# Patient Record
Sex: Male | Born: 1955 | Race: White | Hispanic: No | Marital: Single | Smoking: Never smoker
Health system: Southern US, Community
[De-identification: ages and names within clinical notes are randomized; demographics above are authoritative.]

## PROBLEM LIST (undated history)

## (undated) DIAGNOSIS — I10 Essential (primary) hypertension: Secondary | ICD-10-CM

## (undated) DIAGNOSIS — H9193 Unspecified hearing loss, bilateral: Secondary | ICD-10-CM

---

## 2004-08-22 ENCOUNTER — Ambulatory Visit: Payer: Self-pay | Admitting: Gastroenterology

## 2007-01-26 ENCOUNTER — Emergency Department: Payer: Self-pay | Admitting: Emergency Medicine

## 2007-01-28 ENCOUNTER — Ambulatory Visit: Payer: Self-pay | Admitting: Emergency Medicine

## 2007-09-10 ENCOUNTER — Other Ambulatory Visit: Payer: Self-pay

## 2007-09-10 ENCOUNTER — Emergency Department: Payer: Self-pay | Admitting: Internal Medicine

## 2007-09-19 ENCOUNTER — Ambulatory Visit: Payer: Self-pay | Admitting: Surgery

## 2007-09-24 ENCOUNTER — Ambulatory Visit: Payer: Self-pay | Admitting: Surgery

## 2007-11-27 ENCOUNTER — Ambulatory Visit: Payer: Self-pay | Admitting: Surgery

## 2007-12-01 ENCOUNTER — Ambulatory Visit: Payer: Self-pay | Admitting: Surgery

## 2008-01-05 ENCOUNTER — Ambulatory Visit: Payer: Self-pay | Admitting: Gastroenterology
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
## 2008-09-28 IMAGING — US ABDOMEN ULTRASOUND
1 series · 17 of 25 positions shown · non-contrast
Comparison: none

REASON FOR EXAM: Abdominal pain
COMMENTS:

[Series 1: abdomen ultrasound · 17 of 54 slices shown]
[im 1/54]
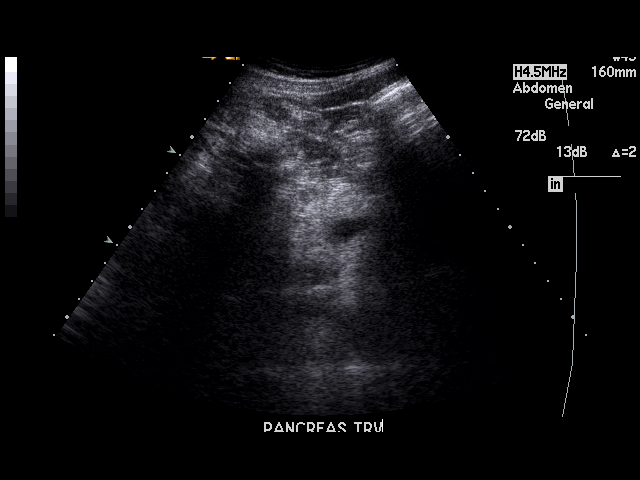
[im 5/54]
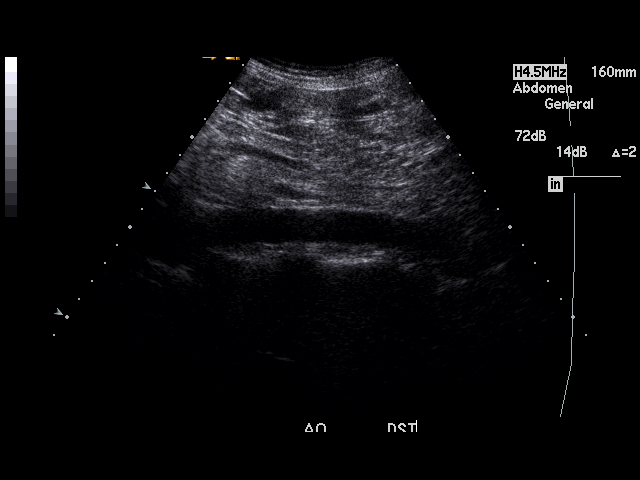
[im 7/54]
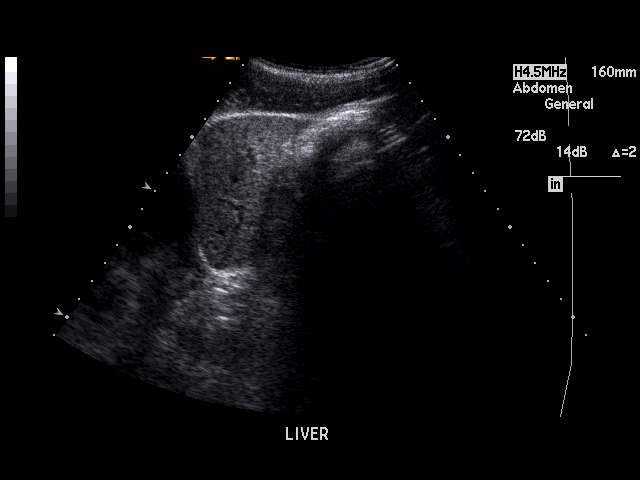
[im 12/54]
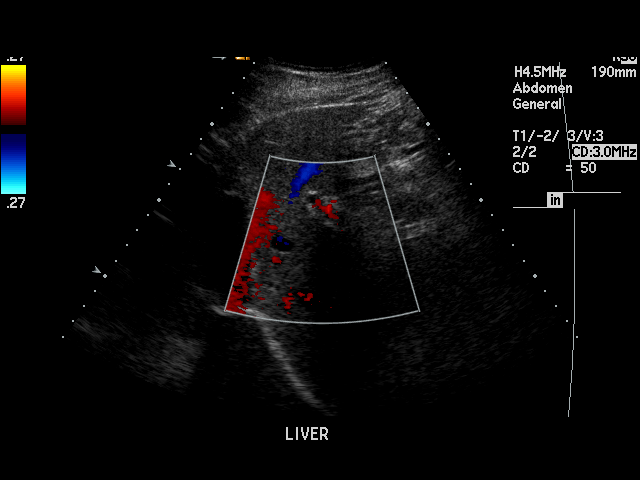
[im 14/54]
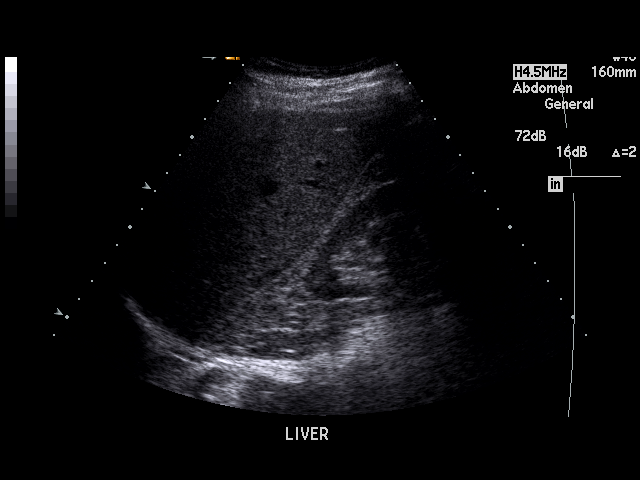
[im 18/54]
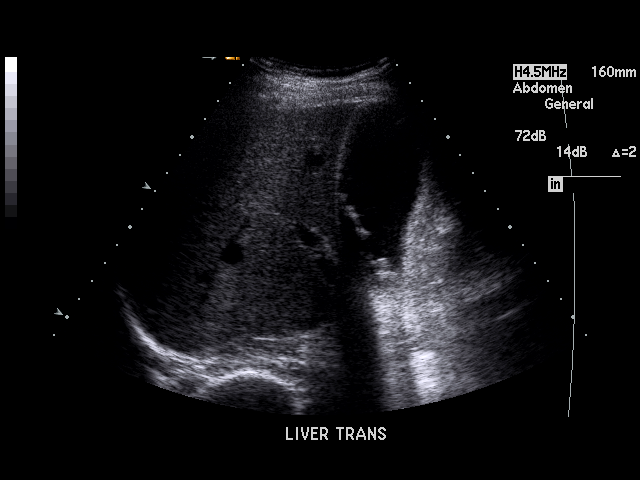
[im 20/54]
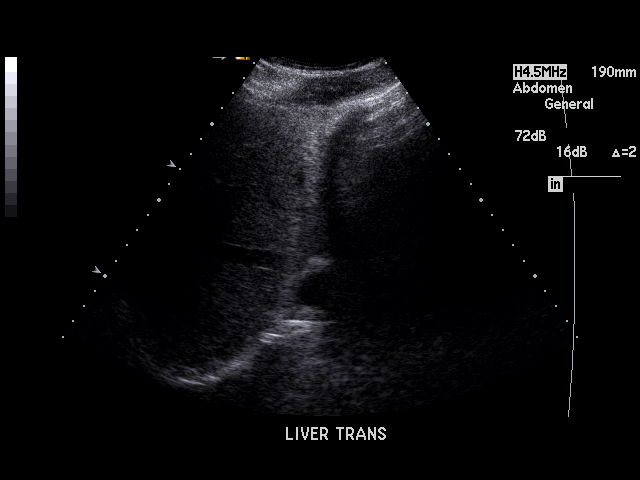
[im 25/54]
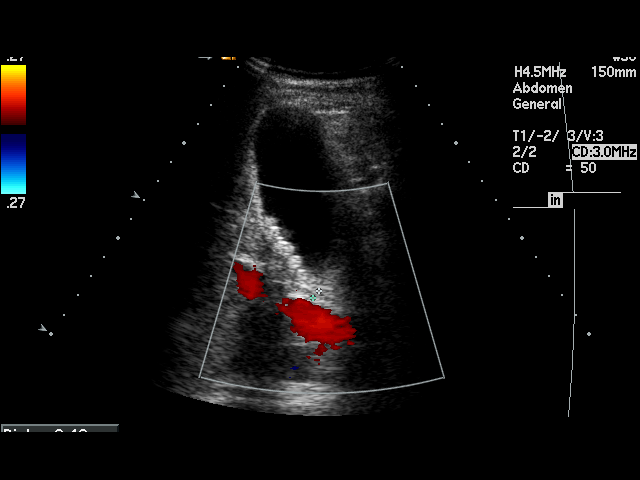
[im 27/54]
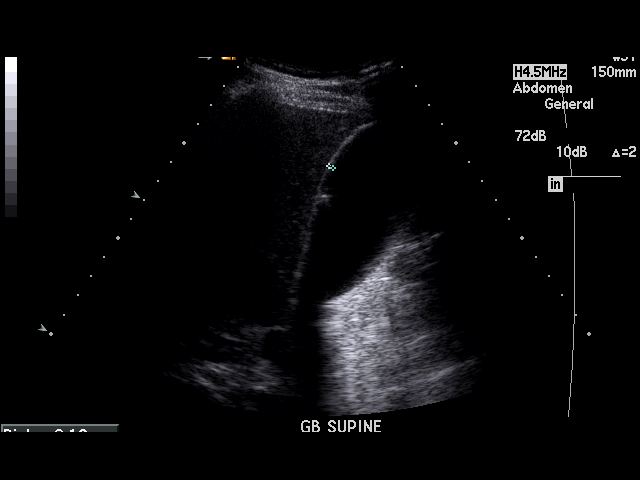
[im 29/54]
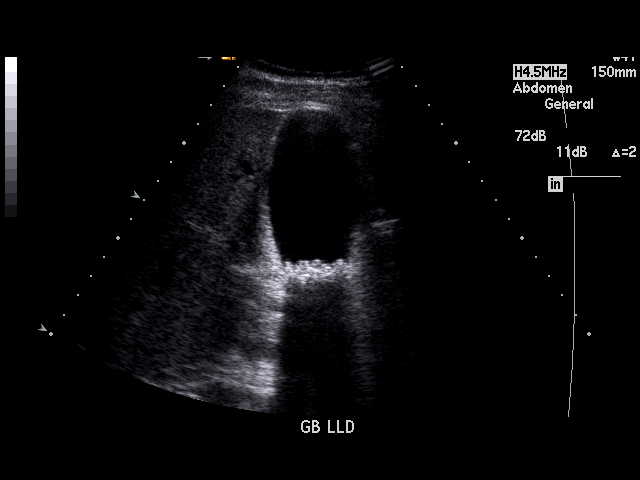
[im 34/54]
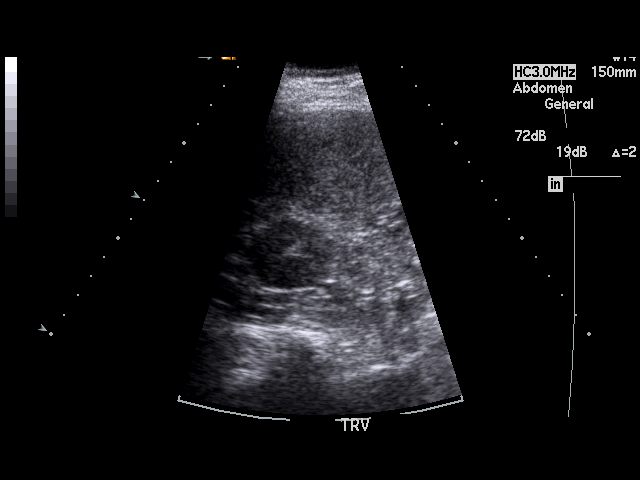
[im 36/54]
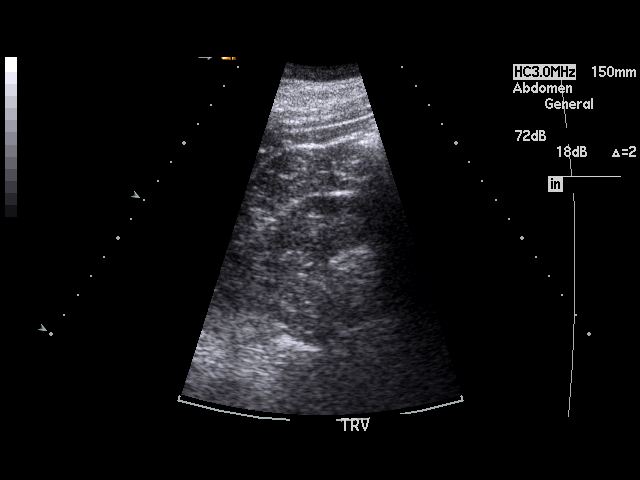
[im 40/54]
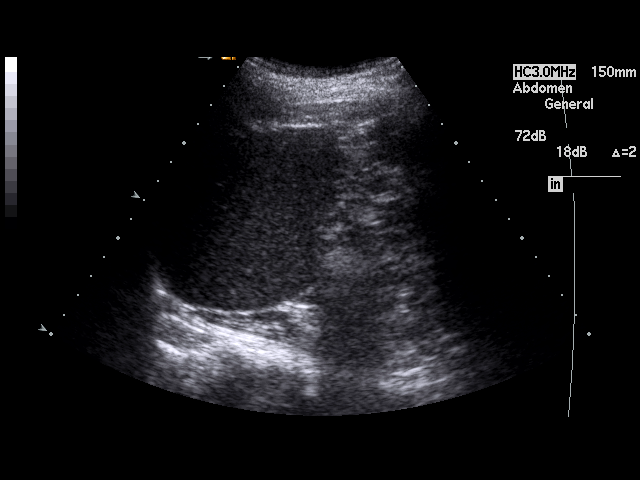
[im 42/54]
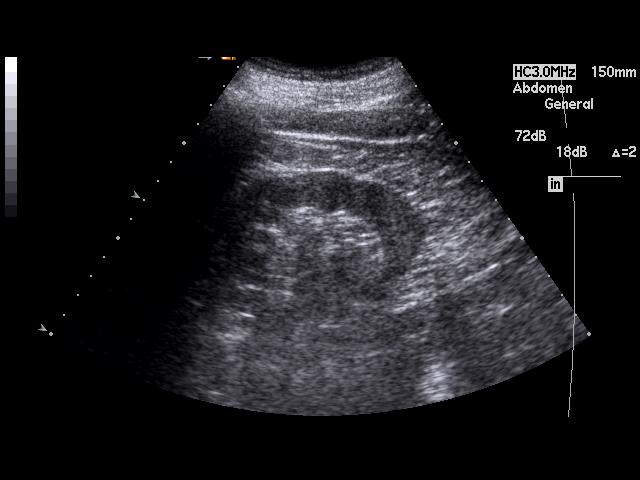
[im 47/54]
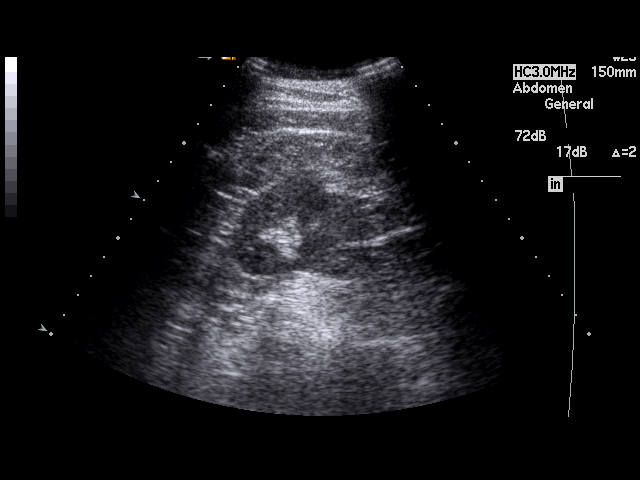
[im 49/54]
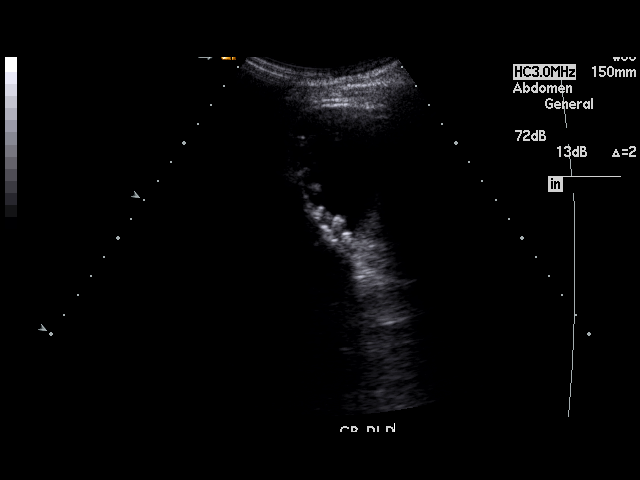
[im 54/54]
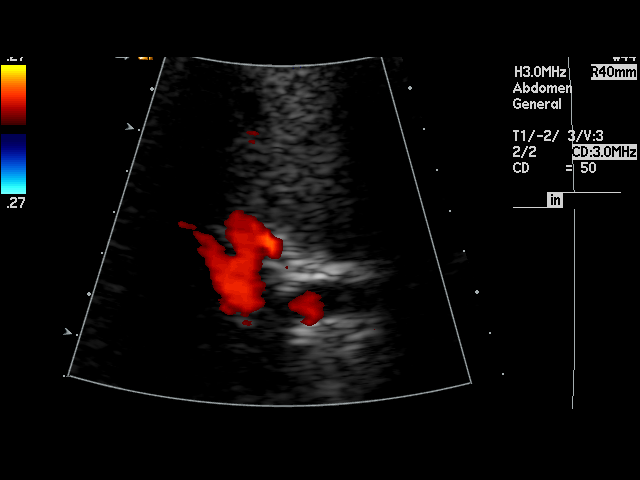

[17 of 25 positions shown; findings below may reference images not displayed]

PROCEDURE:     US  - US ABDOMEN GENERAL SURVEY  - September 10, 2007  [DATE]

RESULT:     The gallbladder contains multiple, echogenic, mobile, shadowing
stones. There is no sonographic Murphy's sign. No gallbladder wall
thickening or pericholecystic fluid is seen. The common bile duct measures
4.0 mm in diameter. The liver exhibits normal echotexture with no focal mass
or ductal dilation. Portal venous flow is normal in direction toward the
liver. The pancreas is only partially imaged due to the presence of bowel
gas. The spleen, abdominal aorta and kidneys exhibit no acute abnormality.
There is no evidence of ascites.
IMPRESSION: 1.  There are multiple, mobile gallstones present. There is no positive
sonographic Murphy's sign.
2.  The pancreas is partially obscured due to the presence of bowel gas.

This report was called to the [HOSPITAL] the conclusion of the
study.

## 2013-05-11 ENCOUNTER — Ambulatory Visit: Payer: Self-pay | Admitting: Gastroenterology

## 2014-10-15 ENCOUNTER — Ambulatory Visit
Admission: EM | Admit: 2014-10-15 | Discharge: 2014-10-15 | Disposition: A | Discharge status: Home / Self Care | Payer: Medicare HMO | Attending: Family Medicine | Admitting: Family Medicine

## 2014-10-15 ENCOUNTER — Encounter: Payer: Self-pay | Admitting: Emergency Medicine

## 2014-10-15 DIAGNOSIS — J301 Allergic rhinitis due to pollen: Secondary | ICD-10-CM

## 2014-10-15 HISTORY — DX: Unspecified hearing loss, bilateral: H91.93

## 2014-10-15 MED ORDER — FLUTICASONE PROPIONATE 50 MCG/ACT NA SUSP: 2.0000 | Freq: Every day | NASAL | Status: AC

## 2014-10-15 MED ORDER — SALINE SPRAY 0.65 % NA SOLN: 2.0000 | NASAL | Status: AC

## 2014-10-15 NOTE — Discharge Instructions (Signed)

## 2014-10-15 NOTE — ED Notes (Signed)
Pt reports nose difficult to breath through.
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
## 2017-03-09 ENCOUNTER — Encounter: Payer: Self-pay | Admitting: Emergency Medicine

## 2017-03-09 ENCOUNTER — Emergency Department
Admission: EM | Admit: 2017-03-09 | Discharge: 2017-03-09 | Disposition: A | Discharge status: Home / Self Care | Payer: Medicare Other | Attending: Emergency Medicine | Admitting: Emergency Medicine

## 2017-03-09 ENCOUNTER — Emergency Department: Payer: Medicare Other

## 2017-03-09 DIAGNOSIS — J189 Pneumonia, unspecified organism: Secondary | ICD-10-CM

## 2017-03-09 DIAGNOSIS — Y9389 Activity, other specified: Secondary | ICD-10-CM | POA: Insufficient documentation

## 2017-03-09 DIAGNOSIS — T17308A Unspecified foreign body in larynx causing other injury, initial encounter: Secondary | ICD-10-CM | POA: Diagnosis not present

## 2017-03-09 DIAGNOSIS — Y33XXXA Other specified events, undetermined intent, initial encounter: Secondary | ICD-10-CM | POA: Insufficient documentation

## 2017-03-09 DIAGNOSIS — Y999 Unspecified external cause status: Secondary | ICD-10-CM | POA: Insufficient documentation

## 2017-03-09 DIAGNOSIS — R55 Syncope and collapse: Secondary | ICD-10-CM | POA: Diagnosis present

## 2017-03-09 DIAGNOSIS — J181 Lobar pneumonia, unspecified organism: Secondary | ICD-10-CM | POA: Insufficient documentation

## 2017-03-09 DIAGNOSIS — Y929 Unspecified place or not applicable: Secondary | ICD-10-CM | POA: Insufficient documentation

## 2017-03-09 MED ORDER — PREDNISONE 10 MG (21) PO TBPK: ORAL_TABLET | Freq: Every day | ORAL | 0 refills | Status: DC

## 2017-03-09 MED ORDER — CEFTRIAXONE SODIUM 1 G IJ SOLR
1.0000 g | INTRAMUSCULAR | Status: DC
  Administered 2017-03-09: 1 g via INTRAMUSCULAR
  Filled 2017-03-09: qty 10

## 2017-03-09 MED ORDER — IPRATROPIUM-ALBUTEROL 0.5-2.5 (3) MG/3ML IN SOLN
3.0000 mL | Freq: Once | RESPIRATORY_TRACT | Status: AC
  Administered 2017-03-09: 3 mL via RESPIRATORY_TRACT
  Filled 2017-03-09: qty 3

## 2017-03-09 MED ORDER — PREDNISONE 20 MG PO TABS
60.0000 mg | ORAL_TABLET | Freq: Once | ORAL | Status: AC
  Administered 2017-03-09: 60 mg via ORAL
  Filled 2017-03-09: qty 3

## 2017-03-09 MED ORDER — AMOXICILLIN-POT CLAVULANATE 875-125 MG PO TABS: 1.0000 | ORAL_TABLET | Freq: Two times a day (BID) | ORAL | 0 refills | Status: AC

## 2017-03-09 MED ORDER — ALBUTEROL SULFATE HFA 108 (90 BASE) MCG/ACT IN AERS: 2.0000 | INHALATION_SPRAY | Freq: Four times a day (QID) | RESPIRATORY_TRACT | 2 refills | Status: AC | PRN

## 2017-03-09 MED ORDER — AMOXICILLIN-POT CLAVULANATE 875-125 MG PO TABS: 1.0000 | ORAL_TABLET | Freq: Once | ORAL | Status: DC

## 2017-03-09 NOTE — ED Notes (Signed)
ED Provider at bedside. 

## 2017-03-09 NOTE — ED Triage Notes (Signed)
Patient was at home and choked on a cheeseburger.  Family removed the obstruction but patient states he feels there is still something in his throat.  Pt in NAD and breathing normally.  Pt has hx of hypertension and MR.

## 2017-03-09 NOTE — ED Notes (Signed)
Pt r/f XR

## 2017-03-09 NOTE — ED Provider Notes (Signed)
[facility] Emergency Department Provider Note       Time seen: ----------------------------------------- 7:50 PM on 03/09/2017 -----------------------------------------   I have reviewed the triage vital signs and the nursing notes.  HISTORY   Chief Complaint Near Syncope    HPI Chad Vazquez is a 62 y.o. male with no significant past medical history who presents to the ED after he choked on a cheeseburger.  Family remove the obstruction but patient states he feels like there is still something in his throat.  He arrives in no distress and is breathing normally.  He does have a history of hypertension and MR.  Past Medical History:  Diagnosis Date  . Hearing difficulty of both ears    wears hearing aids    There are no active problems to display for this patient.   History reviewed. No pertinent surgical history.  Allergies Patient has no known allergies.  Social History Social History   Tobacco Use  . Smoking status: Never Smoker  . Smokeless tobacco: Never Used  Substance Use Topics  . Alcohol use: No  . Drug use: Not on file    Review of Systems Constitutional: Negative for fever. Cardiovascular: Negative for chest pain. Respiratory: Negative for shortness of breath. Gastrointestinal: Positive for recent choking Musculoskeletal: Negative for back pain. Skin: Negative for rash. Neurological: Negative for headaches, focal weakness or numbness.  All systems negative/normal/unremarkable except as stated in the HPI  ____________________________________________   PHYSICAL EXAM:  VITAL SIGNS: ED Triage Vitals [03/09/17 1948]  Enc Vitals Group     BP (!) 155/96     Pulse Rate 86     Resp 16     Temp 98.6 F (37 C)     Temp Source Oral     SpO2 94 %     Weight      Height      Head Circumference      Peak Flow      Pain Score      Pain Loc      Pain Edu?      Excl. in [place]?     Constitutional: Alert and oriented.  Well appearing and in no distress. Eyes: Conjunctivae are normal. Normal extraocular movements. ENT   Head: Normocephalic and atraumatic.   Nose: No congestion/rhinnorhea.   Mouth/Throat: Mucous membranes are moist.   Neck: No stridor. Cardiovascular: Normal rate, regular rhythm. No murmurs, rubs, or gallops. Respiratory: Normal respiratory effort without tachypnea nor retractions.  Mild wheezing is noted in the left lung Gastrointestinal: Soft and nontender. Normal bowel sounds Musculoskeletal: Nontender with normal range of motion in extremities. No lower extremity tenderness nor edema. Neurologic:  Normal speech and language. No gross focal neurologic deficits are appreciated.  Skin:  Skin is warm, dry and intact. No rash noted. Psychiatric: Mood and affect are normal. Speech and behavior are normal.  ____________________________________________  ED COURSE:  As part of my medical decision making, I reviewed the following data within the [facility] History obtained from family if available, nursing notes, old chart and ekg, as well as notes from prior ED visits. Patient presented for a choking episode, we will assess with labs and imaging as indicated at this time.   Procedures ____________________________________________   RADIOLOGY Images were viewed by me  Chest x-ray IMPRESSION: Probable left upper lobe pneumonia. Followup PA and lateral chest X-ray is recommended in 3-4 weeks following trial of antibiotic therapy to ensure resolution and exclude underlying malignancy.  ____________________________________________  DIFFERENTIAL DIAGNOSIS   Aspiration, choking, pneumonia, bronchitis  FINAL ASSESSMENT AND PLAN  Choking episode, pneumonia   Plan: Patient had presented for a choking episode that occurred at home. Patient's imaging revealed a left upper lobe pneumonia.  He has not had symptoms of pneumonia, he states he is not coughing or  feeling short of breath and that he does have wheezing clinically.  I think it is unlikely this is related to the choking episode but it is possible.  He was given a gram of Rocephin here and will be discharged with Augmentin, steroids and albuterol.  He wants to go home and have advised him and his family to have close outpatient follow-up.   Laurence Aly, MD   Note: This note was generated in part or whole with voice recognition software. Voice recognition is usually quite accurate but there are transcription errors that can and very often do occur. I apologize for any typographical errors that were not detected and corrected.     Earleen Newport, MD 03/09/17 2052

## 2017-03-29 ENCOUNTER — Other Ambulatory Visit: Payer: Self-pay | Admitting: Nurse Practitioner

## 2017-04-04 ENCOUNTER — Ambulatory Visit
Admission: RE | Admit: 2017-04-04 | Discharge: 2017-04-04 | Disposition: A | Discharge status: Home / Self Care | Payer: Medicare Other | Source: Ambulatory Visit | Attending: Nurse Practitioner | Admitting: Nurse Practitioner

## 2017-04-04 ENCOUNTER — Other Ambulatory Visit: Payer: Self-pay | Admitting: Nurse Practitioner

## 2017-04-04 DIAGNOSIS — R918 Other nonspecific abnormal finding of lung field: Secondary | ICD-10-CM | POA: Insufficient documentation

## 2017-04-04 DIAGNOSIS — J189 Pneumonia, unspecified organism: Secondary | ICD-10-CM | POA: Diagnosis present

## 2017-04-05 ENCOUNTER — Encounter: Payer: Self-pay | Admitting: Gastroenterology

## 2017-05-22 ENCOUNTER — Ambulatory Visit (INDEPENDENT_AMBULATORY_CARE_PROVIDER_SITE_OTHER): Payer: Medicare Other | Admitting: Gastroenterology

## 2017-05-22 ENCOUNTER — Encounter: Payer: Self-pay | Admitting: Gastroenterology

## 2017-05-22 ENCOUNTER — Ambulatory Visit: Payer: Medicare Other | Admitting: Gastroenterology

## 2017-05-22 VITALS — BP 149/88 | HR 73 | Ht 66.0 in | Wt 189.4 lb

## 2017-05-22 DIAGNOSIS — T17320A Food in larynx causing asphyxiation, initial encounter: Secondary | ICD-10-CM

## 2017-05-22 DIAGNOSIS — K635 Polyp of colon: Secondary | ICD-10-CM | POA: Insufficient documentation

## 2017-05-22 DIAGNOSIS — E039 Hypothyroidism, unspecified: Secondary | ICD-10-CM | POA: Insufficient documentation

## 2017-05-22 DIAGNOSIS — C760 Malignant neoplasm of head, face and neck: Secondary | ICD-10-CM | POA: Insufficient documentation

## 2017-05-22 DIAGNOSIS — I10 Essential (primary) hypertension: Secondary | ICD-10-CM | POA: Insufficient documentation

## 2017-05-22 NOTE — Progress Notes (Signed)
Chad Bellows MD, MRCP(U.K) [address]  [place]  [place],  [address]  Main: [phone]  Fax: [phone]   Gastroenterology Consultation  Referring Provider:     Elisabeth Cara, NP Primary Care Physician:  Elisabeth Cara, NP Primary Gastroenterologist:  Dr. Jonathon Vazquez  Reason for Consultation:     Dysphagia         HPI:   Chad Vazquez is a 62 y.o. y/o male referred for consultation & management  by Dr. Elisabeth Cara, NP.    He presented in 03/2017 to the ER when he choked on a cheeseburger . At that time he also had a pneumonia. He felt well and was discharged home.   He is here today with his sister.    He says in 03/2017 that was the only time when he choked on a piece of hamburger. Never happened before or after.  At this time no issues swallowing , does not hurt when he swallows. Liquids go down well. His last hamburger was eaten last week withno issues. Can eat steak and has had no issues. He has false teeth which are intact . Denies any episodes of coughing when he eats . His sister believes the isolated episode was due to improper chewing of food.    Past Medical History:  Diagnosis Date  . Hearing difficulty of both ears    wears hearing aids    History reviewed. No pertinent surgical history.  Prior to Admission medications   Medication Sig Start Date End Date Taking? Authorizing Provider  albuterol (PROVENTIL HFA;VENTOLIN HFA) 108 (90 Base) MCG/ACT inhaler Inhale 2 puffs into the lungs every 6 (six) hours as needed for wheezing or shortness of breath. 03/09/17  Yes Earleen Newport, MD  atorvastatin (LIPITOR) 20 MG tablet TAKE 1 TABLET BY MOUTH AT BEDTIME FOR HIGH CHOLESTEROL 04/25/17  Yes [provider]  doxycycline (VIBRA-TABS) 100 MG tablet Take 100 mg by mouth. 07/17/16  Yes [provider]  fluticasone (FLONASE) 50 MCG/ACT nasal spray Place 2 sprays into both nostrils daily. 10/15/14  Yes Betancourt, Aura Fey, NP    levothyroxine (SYNTHROID, LEVOTHROID) 75 MCG tablet Take by mouth.   Yes [provider]  losartan (COZAAR) 100 MG tablet Take 100 mg by mouth daily. for high blood pressure 05/10/17  Yes [provider]  metoprolol succinate (TOPROL-XL) 100 MG 24 hr tablet Take 100 mg by mouth daily. for high blood pressure 04/02/17  Yes [provider]  predniSONE (STERAPRED UNI-PAK 21 TAB) 10 MG (21) TBPK tablet Take by mouth daily. Dispense steroid taper pack as directed 03/09/17  Yes Earleen Newport, MD  sodium chloride (OCEAN) 0.65 % SOLN nasal spray Place 2 sprays into both nostrils every 2 (two) hours while awake. 10/15/14  Yes Betancourt, Aura Fey, NP    History reviewed. No pertinent family history.   Social History   Tobacco Use  . Smoking status: Never Smoker  . Smokeless tobacco: Never Used  Substance Use Topics  . Alcohol use: No  . Drug use: Never    Allergies as of 05/22/2017  . (No Known Allergies)    Review of Systems:    All systems reviewed and negative except where noted in HPI.   Physical Exam:  BP (!) 149/88 (BP Location: Left Arm, Patient Position: Sitting, Cuff Size: Normal)   Pulse 73   Ht 5\' 6"  (1.676 m)   Wt 189 lb 6.4 oz (85.9 kg)   BMI 30.57 kg/m  No LMP for male patient. Psych:  Alert and cooperative. Normal mood and affect. General:   Alert,  pleasant and cooperative in NAD Head:  Normocephalic and atraumatic. Eyes:  Sclera clear, no icterus.   Conjunctiva pink. Ears:  Normal auditory acuity. Nose:  No deformity, discharge, or lesions. Mouth:  False teeth  Neck:  Supple; no masses or thyromegaly. Lungs:  Respirations even and unlabored.  Clear throughout to auscultation.   No wheezes, crackles, or rhonchi. No acute distress. Heart:  Regular rate and rhythm; no murmurs, clicks, rubs, or gallops. Abdomen:  Normal bowel sounds.  No bruits.  Soft, non-tender and non-distended without masses, hepatosplenomegaly or hernias noted.  No  guarding or rebound tenderness.    Neurologic:  Alert and oriented x3;  grossly normal neurologically. Skin:  Intact without significant lesions or rashes. No jaundice. Lymph Nodes:  No significant cervical adenopathy.  Imaging Studies: No results found.  Assessment and Plan:   Chad Vazquez is a 62 y.o. y/o male has been referred for ER follow up after an isolated episode of choking on his food.A similar episode has never occurred prior to the ER visit or for two months after. Denies any issues with swollowing at this time. He is here with his sister and she believes he didn't chew his food well on that isolated occasion. Since he has no symptoms at this time, I do not believe he should have any evaluation. If any symptoms recurs please refer back and will discuss further evaluation.   Follow up PRN  Dr Chad Bellows MD,MRCP(U.K)

## 2018-03-11 ENCOUNTER — Other Ambulatory Visit: Payer: Self-pay

## 2018-03-11 ENCOUNTER — Emergency Department
Admission: EM | Admit: 2018-03-11 | Discharge: 2018-03-11 | Disposition: A | Discharge status: Home / Self Care | Payer: Medicare Other | Attending: Emergency Medicine | Admitting: Emergency Medicine

## 2018-03-11 DIAGNOSIS — K625 Hemorrhage of anus and rectum: Secondary | ICD-10-CM | POA: Diagnosis present

## 2018-03-11 DIAGNOSIS — Z85828 Personal history of other malignant neoplasm of skin: Secondary | ICD-10-CM | POA: Insufficient documentation

## 2018-03-11 DIAGNOSIS — K649 Unspecified hemorrhoids: Secondary | ICD-10-CM | POA: Insufficient documentation

## 2018-03-11 DIAGNOSIS — E039 Hypothyroidism, unspecified: Secondary | ICD-10-CM | POA: Insufficient documentation

## 2018-03-11 DIAGNOSIS — Z79899 Other long term (current) drug therapy: Secondary | ICD-10-CM | POA: Diagnosis not present

## 2018-03-11 DIAGNOSIS — K922 Gastrointestinal hemorrhage, unspecified: Secondary | ICD-10-CM | POA: Diagnosis not present

## 2018-03-11 DIAGNOSIS — I10 Essential (primary) hypertension: Secondary | ICD-10-CM | POA: Diagnosis not present

## 2018-03-11 HISTORY — DX: Essential (primary) hypertension: I10

## 2018-03-11 LAB — COMPREHENSIVE METABOLIC PANEL
ALK PHOS: 73 U/L (ref 38–126)
ALT: 21 U/L (ref 0–44)
ANION GAP: 4 — AB (ref 5–15)
AST: 18 U/L (ref 15–41)
Albumin: 3.6 g/dL (ref 3.5–5.0)
BUN: 39 mg/dL — ABNORMAL HIGH (ref 8–23)
CALCIUM: 8.5 mg/dL — AB (ref 8.9–10.3)
CO2: 29 mmol/L (ref 22–32)
Chloride: 108 mmol/L (ref 98–111)
Creatinine, Ser: 2.15 mg/dL — ABNORMAL HIGH (ref 0.61–1.24)
GFR calc Af Amer: 37 mL/min — ABNORMAL LOW (ref >60)
GFR, EST NON AFRICAN AMERICAN: 32 mL/min — AB (ref >60)
Glucose, Bld: 133 mg/dL — ABNORMAL HIGH (ref 70–99)
Potassium: 4.3 mmol/L (ref 3.5–5.1)
SODIUM: 141 mmol/L (ref 135–145)
Total Bilirubin: 0.8 mg/dL (ref 0.3–1.2)
Total Protein: 6.4 g/dL — ABNORMAL LOW (ref 6.5–8.1)

## 2018-03-11 LAB — CBC
HCT: 38.2 % — ABNORMAL LOW (ref 39.0–52.0)
Hemoglobin: 12.3 g/dL — ABNORMAL LOW (ref 13.0–17.0)
MCH: 28.9 pg (ref 26.0–34.0)
MCHC: 32.2 g/dL (ref 30.0–36.0)
MCV: 89.9 fL (ref 80.0–100.0)
PLATELETS: 192 10*3/uL (ref 150–400)
RBC: 4.25 MIL/uL (ref 4.22–5.81)
RDW: 14.3 % (ref 11.5–15.5)
WBC: 8.3 10*3/uL (ref 4.0–10.5)
nRBC: 0 % (ref 0.0–0.2)

## 2018-03-11 LAB — TYPE AND SCREEN
ABO/RH(D): O POS
ANTIBODY SCREEN: NEGATIVE

## 2018-03-11 MED ORDER — HYDROCORTISONE 2.5 % RE CREA: 1.0000 "application " | TOPICAL_CREAM | Freq: Two times a day (BID) | RECTAL | 0 refills | Status: DC

## 2018-03-11 NOTE — ED Notes (Signed)
T&S and rainbow sent to lab.

## 2018-03-11 NOTE — ED Provider Notes (Signed)
[facility] Emergency Department Provider Note  ____________________________________________   I have reviewed the triage vital signs and the nursing notes.   HISTORY  Chief Complaint Rectal Bleeding   History limited by: Not Limited, some history obtained from brother and POA   HPI Chad Vazquez is a 63 y.o. male who presents to the emergency department today because of concerns for rectal bleeding.  Brother states that he first noticed it last night.  The patient had stayed at a friend's house the night before.  Apparently they do engage in sexual encounters together.  The brother states that this is not the first time that the patient has had some rectal bleeding after staying with this friend.  Patient denies any pain.  He denies being on any blood thinners. The brother does not have concern that this was not a consensual encounter.   Per medical record review patient has a history of HTN, colon polyps.  Past Medical History:  Diagnosis Date  . Hearing difficulty of both ears    wears hearing aids  . Hypertension     Patient Active Problem List   Diagnosis Date Noted  . Colon polyps 05/22/2017  . HTN (hypertension) 05/22/2017  . Hypothyroidism, unspecified 05/22/2017  . 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 No pertinent surgical history.  Prior to Admission medications   Medication Sig Start Date End Date Taking? Authorizing Provider  albuterol (PROVENTIL HFA;VENTOLIN HFA) 108 (90 Base) MCG/ACT inhaler Inhale 2 puffs into the lungs every 6 (six) hours as needed for wheezing or shortness of breath. 03/09/17   Earleen Newport, MD  atorvastatin (LIPITOR) 20 MG tablet TAKE 1 TABLET BY MOUTH AT BEDTIME FOR HIGH CHOLESTEROL 04/25/17   [provider]  doxycycline (VIBRA-TABS) 100 MG tablet Take 100 mg by mouth. 07/17/16   [provider]  fluticasone (FLONASE) 50 MCG/ACT nasal spray Place 2 sprays into both  nostrils daily. 10/15/14   Betancourt, Aura Fey, NP  levothyroxine (SYNTHROID, LEVOTHROID) 75 MCG tablet Take by mouth.    [provider]  losartan (COZAAR) 100 MG tablet Take 100 mg by mouth daily. for high blood pressure 05/10/17   [provider]  metoprolol succinate (TOPROL-XL) 100 MG 24 hr tablet Take 100 mg by mouth daily. for high blood pressure 04/02/17   [provider]  predniSONE (STERAPRED UNI-PAK 21 TAB) 10 MG (21) TBPK tablet Take by mouth daily. Dispense steroid taper pack as directed 03/09/17   Earleen Newport, MD  sodium chloride (OCEAN) 0.65 % SOLN nasal spray Place 2 sprays into both nostrils every 2 (two) hours while awake. 10/15/14   Betancourt, Aura Fey, NP    Allergies Patient has no known allergies.  No family history on file.  Social History Social History   Tobacco Use  . Smoking status: Never Smoker  . Smokeless tobacco: Never Used  Substance Use Topics  . Alcohol use: No  . Drug use: Never    Review of Systems Constitutional: No fever/chills Eyes: No visual changes. ENT: No sore throat. Cardiovascular: Denies chest pain. Respiratory: Denies shortness of breath. Gastrointestinal: No abdominal pain. Positive for rectal bleeding   Genitourinary: Negative for dysuria. Musculoskeletal: Negative for back pain. Skin: Negative for rash. Neurological: Negative for headaches, focal weakness or numbness.  ____________________________________________   PHYSICAL EXAM:  VITAL SIGNS: ED Triage Vitals  Enc Vitals Group     BP 03/11/18 1300 (!) 124/55     Pulse Rate  03/11/18 1300 62     Resp 03/11/18 1300 16     Temp 03/11/18 1300 98.3 F (36.8 C)     Temp Source 03/11/18 1300 Oral     SpO2 03/11/18 1300 98 %     Weight 03/11/18 1301 201 lb (91.2 kg)     Height 03/11/18 1301 5\' 8"  (1.727 m)     Head Circumference --      Peak Flow --      Pain Score 03/11/18 1301 0   Constitutional: Alert and oriented.  Eyes: Conjunctivae are  normal.  ENT      Head: Normocephalic and atraumatic.      Nose: No congestion/rhinnorhea.      Mouth/Throat: Mucous membranes are moist.      Neck: No stridor. Hematological/Lymphatic/Immunilogical: No cervical lymphadenopathy. Cardiovascular: Normal rate, regular rhythm.  No murmurs, rubs, or gallops.  Respiratory: Normal respiratory effort without tachypnea nor retractions. Breath sounds are clear and equal bilaterally. No wheezes/rales/rhonchi. Gastrointestinal: Soft and non tender. No rebound. No guarding.  Rectal: Multiple external hemorrhoids. No red blood on glove.  Musculoskeletal: Normal range of motion in all extremities. No lower extremity edema. Neurologic:  Awake, alert and oriented.  Skin:  Skin is warm, dry and intact. No rash noted. Psychiatric: Mood and affect are normal. Speech and behavior are normal. Patient exhibits appropriate insight and judgment.  ____________________________________________    LABS (pertinent positives/negatives)  CBC wbc 8.3, hgb 12.3, plt 192 CMP na 141, k 4.3, glu 133, cr 2.15, ca 8.5  ____________________________________________   EKG  None  ____________________________________________    RADIOLOGY  None  ____________________________________________   PROCEDURES  Procedures  ____________________________________________   INITIAL IMPRESSION / ASSESSMENT AND PLAN / ED COURSE  Pertinent labs & imaging results that were available during my care of the patient were reviewed by me and considered in my medical decision making (see chart for details).   Patient presented to the emergency department today because of concerns for rectal bleeding.  On exam he does not have any red blood but is guaiac positive.  Does have multiple hemorrhoids.  Given recent activity do wonder if patient had a small hemorrhoid bleed or possible small fissure.  Patient's blood work shows a very slight anemia however vital signs without concerning  findings.  Patient also has a slightly elevated creatinine although I do not see any previous levels.  Patient does follow with nephrology so there apparently is some baseline dysfunction.  At this point think patient can be discharged to follow-up with GI.    ____________________________________________   FINAL CLINICAL IMPRESSION(S) / ED DIAGNOSES  Final diagnoses:  Acute GI bleeding  Hemorrhoids, unspecified hemorrhoid type     Note: This dictation was prepared with Dragon dictation. Any transcriptional errors that result from this process are unintentional     Nance Pear, MD 03/11/18 1801

## 2018-03-11 NOTE — Discharge Instructions (Signed)
Please seek medical attention for any high fevers, chest pain, shortness of breath, change in behavior, persistent vomiting, bloody stool or any other new or concerning symptoms.  

## 2018-03-11 NOTE — ED Notes (Signed)
Small smear of blood underpants. No frank blood noted. Pt oob without noticeable weakness. Dr. Archie Balboa at bedside.

## 2018-03-11 NOTE — ED Triage Notes (Signed)
Pt is here with his old brother and who c/o after visiting a friend on Saturday the pt has c/o having bright red rectal bleeding since, states he is passing blood clots, states has had similar sx in the past ..denies pain

## 2018-03-25 ENCOUNTER — Ambulatory Visit: Payer: Medicare Other | Admitting: Gastroenterology

## 2018-04-23 IMAGING — CR DG CHEST 2V
1 series · 3 of 3 positions shown · non-contrast
Comparison: March 09, 2017

CLINICAL DATA: Recent pneumonia

EXAM:
CHEST  2 VIEW

[Series 1: dg chest 2 view · 0.14mm/px · 3 of 3 slices shown]
[im 1/3]
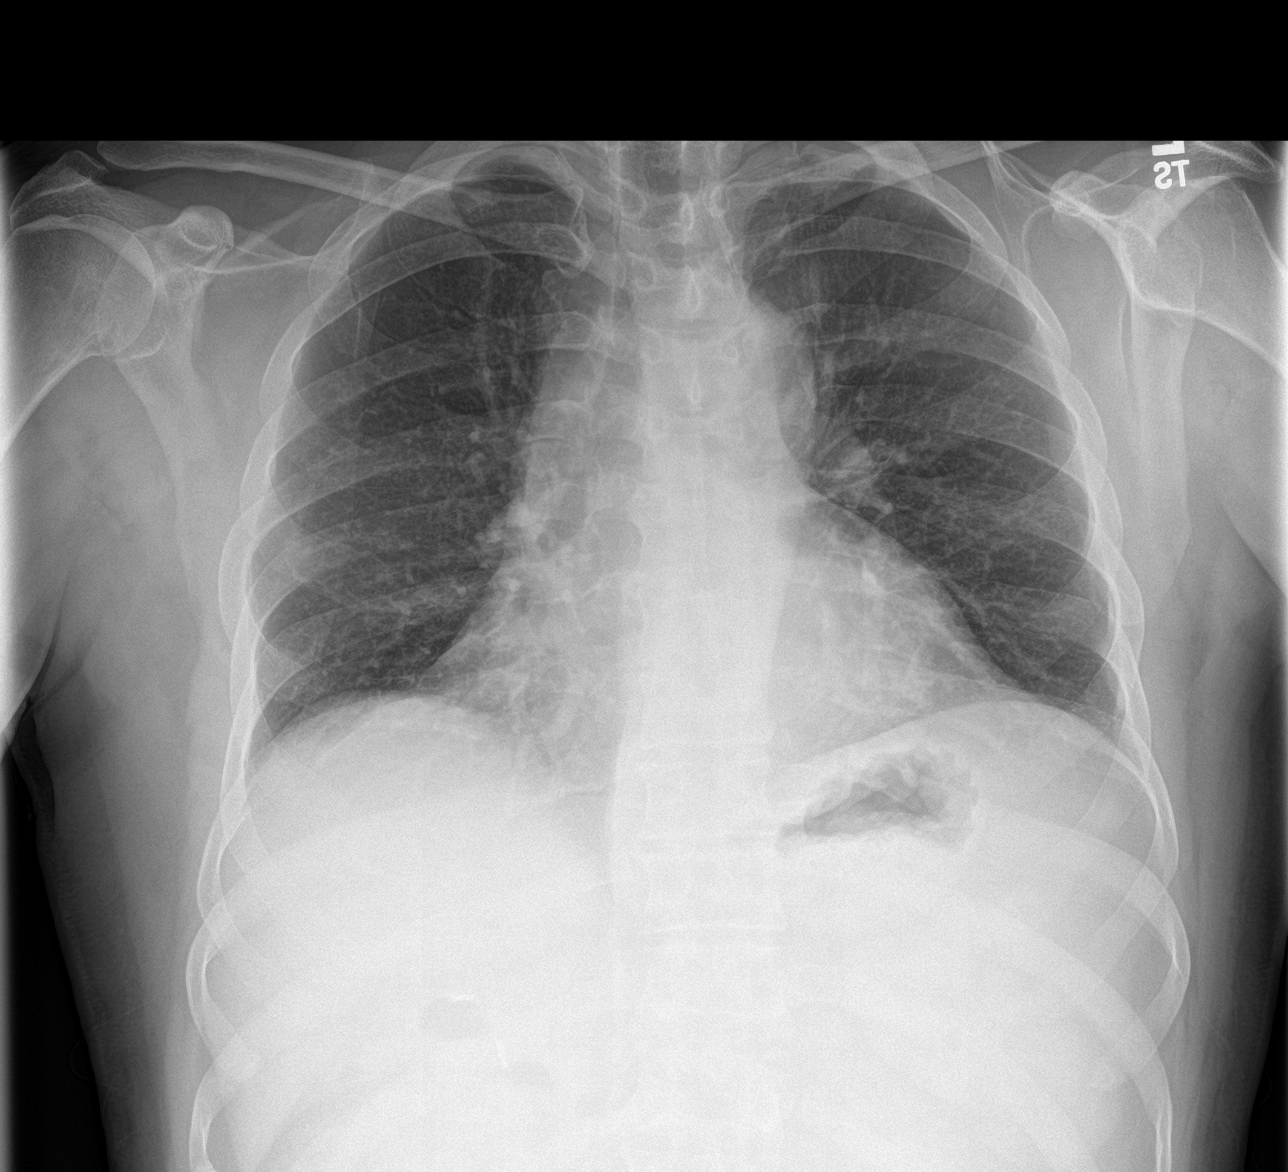
[im 2/3]
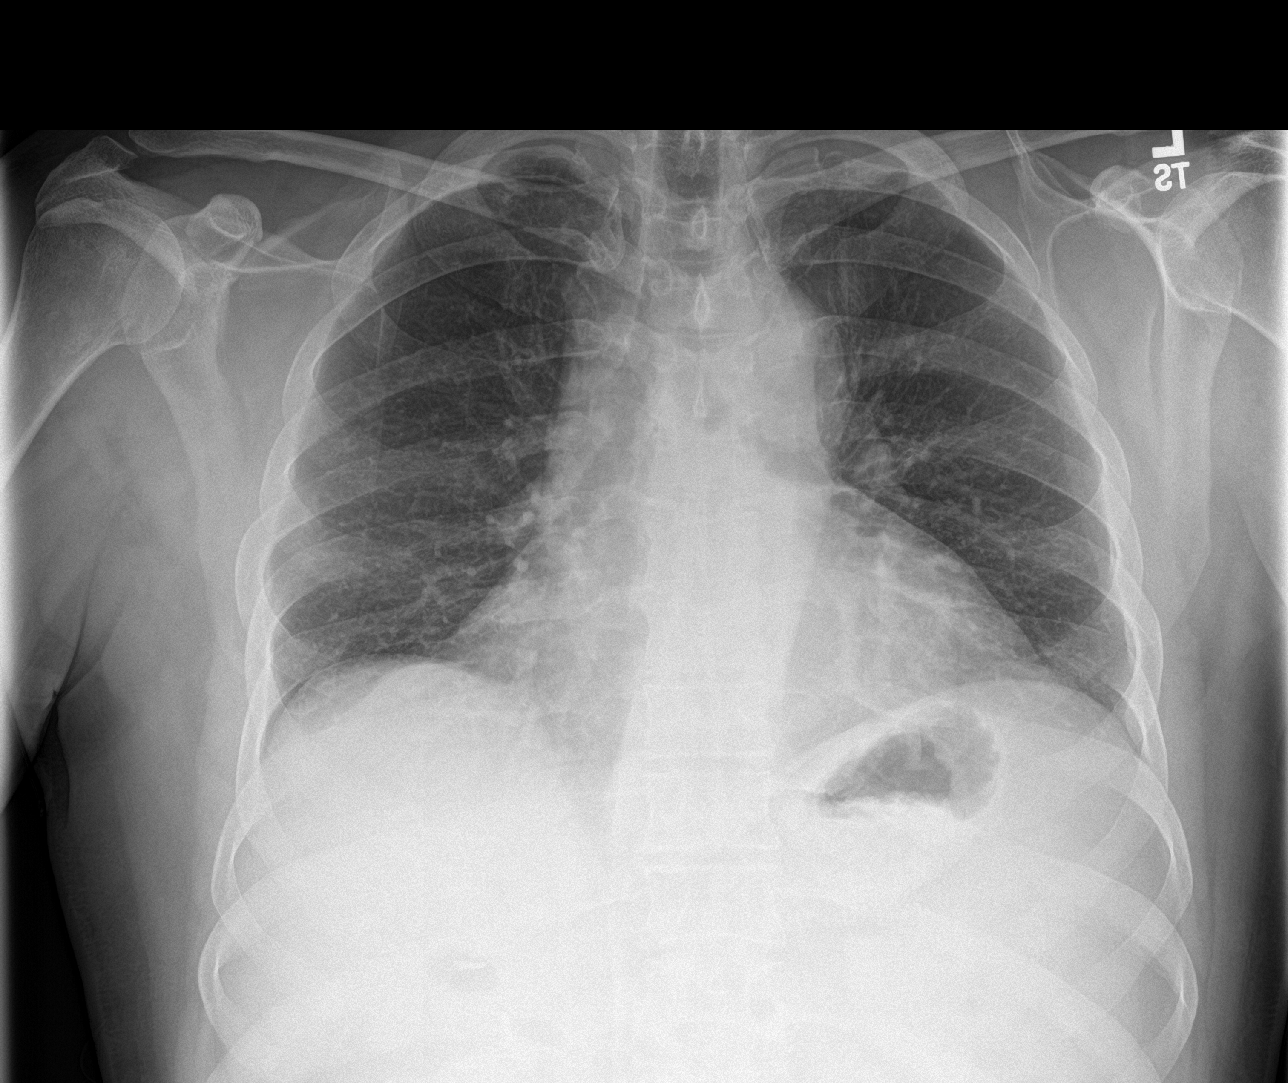
[im 3/3]
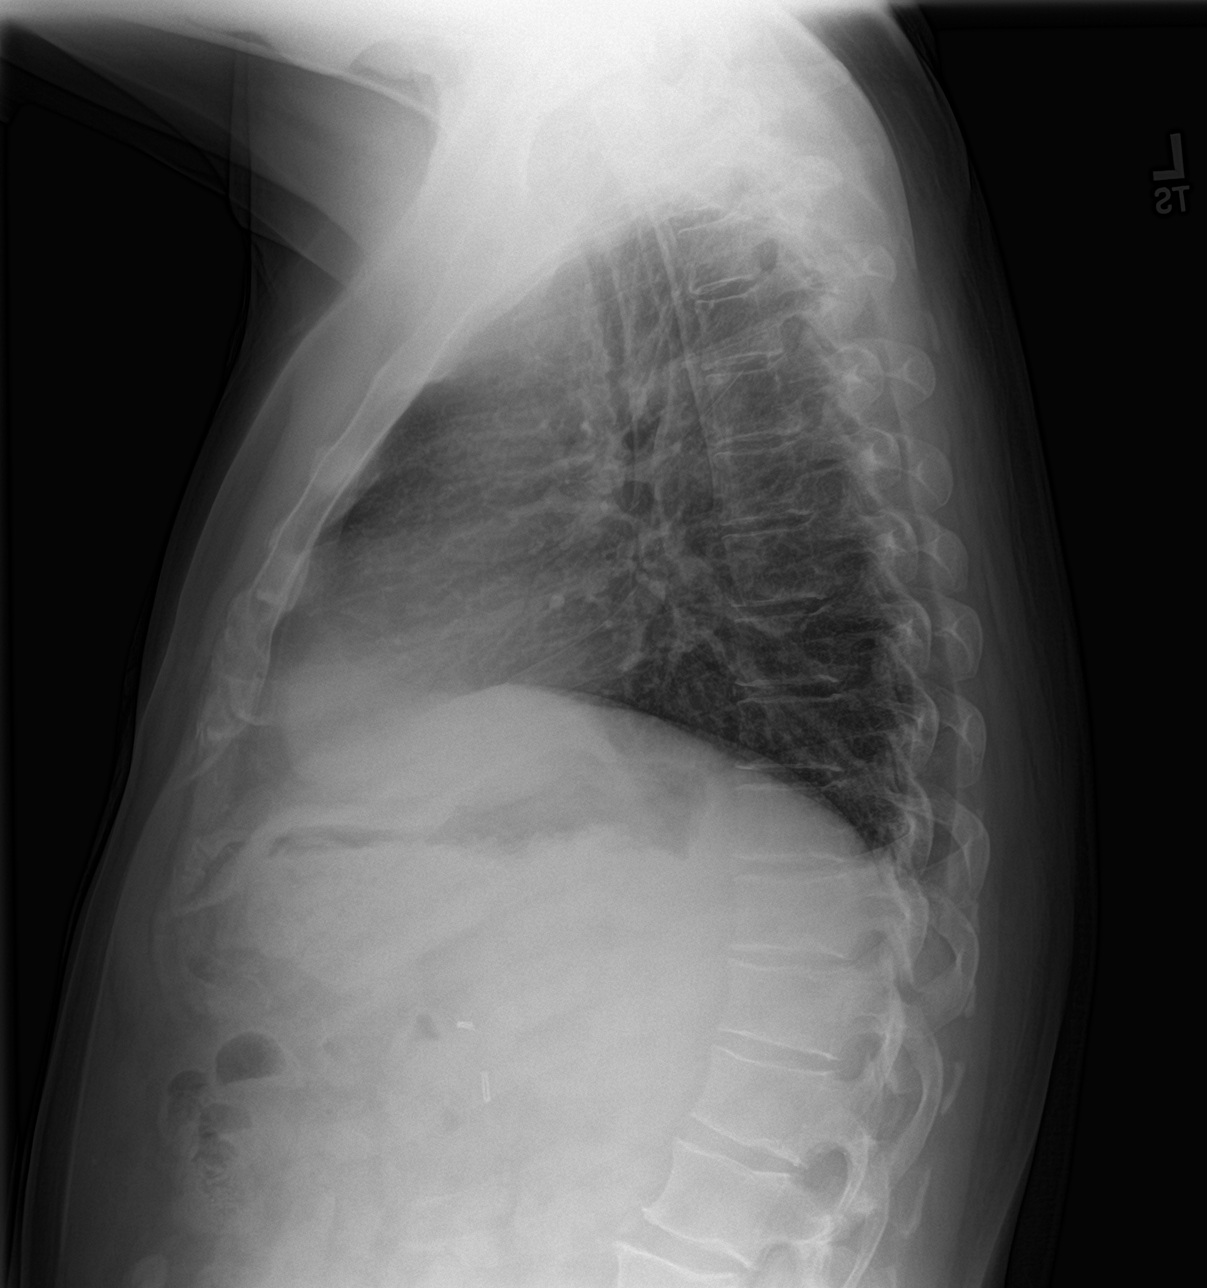

[3 of 3 positions shown; findings below may reference images not displayed]

FINDINGS: There has been interval clearing of airspace opacity from the left
upper lobe. Currently the lungs are clear. Heart is upper normal in
size with pulmonary vascularity within normal limits. No adenopathy.
No bone lesions.
IMPRESSION: Interval clearing of infiltrate from left upper lobe. Currently
lungs clear. Stable cardiac silhouette.

## 2023-04-05 ENCOUNTER — Emergency Department: Payer: 59

## 2023-04-05 ENCOUNTER — Other Ambulatory Visit: Payer: Self-pay

## 2023-04-05 ENCOUNTER — Observation Stay
Admission: EM | Admit: 2023-04-05 | Discharge: 2023-04-12 | Disposition: A | Discharge status: Skilled Nursing Facility | Payer: 59 | Attending: Student | Admitting: Student

## 2023-04-05 DIAGNOSIS — N189 Chronic kidney disease, unspecified: Secondary | ICD-10-CM | POA: Diagnosis not present

## 2023-04-05 DIAGNOSIS — R2689 Other abnormalities of gait and mobility: Secondary | ICD-10-CM | POA: Insufficient documentation

## 2023-04-05 DIAGNOSIS — Y93E1 Activity, personal bathing and showering: Secondary | ICD-10-CM | POA: Diagnosis not present

## 2023-04-05 DIAGNOSIS — S99911A Unspecified injury of right ankle, initial encounter: Secondary | ICD-10-CM | POA: Diagnosis present

## 2023-04-05 DIAGNOSIS — S82891A Other fracture of right lower leg, initial encounter for closed fracture: Secondary | ICD-10-CM | POA: Diagnosis not present

## 2023-04-05 DIAGNOSIS — S82841A Displaced bimalleolar fracture of right lower leg, initial encounter for closed fracture: Secondary | ICD-10-CM | POA: Diagnosis not present

## 2023-04-05 DIAGNOSIS — S93421A Sprain of deltoid ligament of right ankle, initial encounter: Secondary | ICD-10-CM | POA: Insufficient documentation

## 2023-04-05 DIAGNOSIS — F79 Unspecified intellectual disabilities: Secondary | ICD-10-CM | POA: Insufficient documentation

## 2023-04-05 DIAGNOSIS — Z79899 Other long term (current) drug therapy: Secondary | ICD-10-CM | POA: Diagnosis not present

## 2023-04-05 DIAGNOSIS — I129 Hypertensive chronic kidney disease with stage 1 through stage 4 chronic kidney disease, or unspecified chronic kidney disease: Secondary | ICD-10-CM | POA: Insufficient documentation

## 2023-04-05 DIAGNOSIS — S82431A Displaced oblique fracture of shaft of right fibula, initial encounter for closed fracture: Secondary | ICD-10-CM | POA: Diagnosis not present

## 2023-04-05 DIAGNOSIS — Y92002 Bathroom of unspecified non-institutional (private) residence single-family (private) house as the place of occurrence of the external cause: Secondary | ICD-10-CM | POA: Diagnosis not present

## 2023-04-05 DIAGNOSIS — E039 Hypothyroidism, unspecified: Secondary | ICD-10-CM | POA: Insufficient documentation

## 2023-04-05 DIAGNOSIS — W010XXA Fall on same level from slipping, tripping and stumbling without subsequent striking against object, initial encounter: Secondary | ICD-10-CM | POA: Insufficient documentation

## 2023-04-05 DIAGNOSIS — W19XXXA Unspecified fall, initial encounter: Principal | ICD-10-CM

## 2023-04-05 DIAGNOSIS — I10 Essential (primary) hypertension: Secondary | ICD-10-CM | POA: Diagnosis present

## 2023-04-05 DIAGNOSIS — Y92009 Unspecified place in unspecified non-institutional (private) residence as the place of occurrence of the external cause: Secondary | ICD-10-CM

## 2023-04-05 DIAGNOSIS — S82899A Other fracture of unspecified lower leg, initial encounter for closed fracture: Secondary | ICD-10-CM | POA: Diagnosis present

## 2023-04-05 LAB — CBC WITH DIFFERENTIAL/PLATELET
Abs Immature Granulocytes: 0.04 10*3/uL (ref 0.00–0.07)
Basophils Absolute: 0 10*3/uL (ref 0.0–0.1)
Basophils Relative: 0 %
Eosinophils Absolute: 0 10*3/uL (ref 0.0–0.5)
Eosinophils Relative: 0 %
HCT: 46.8 % (ref 39.0–52.0)
Hemoglobin: 15.2 g/dL (ref 13.0–17.0)
Immature Granulocytes: 0 %
Lymphocytes Relative: 7 %
Lymphs Abs: 0.8 10*3/uL (ref 0.7–4.0)
MCH: 28.3 pg (ref 26.0–34.0)
MCHC: 32.5 g/dL (ref 30.0–36.0)
MCV: 87 fL (ref 80.0–100.0)
Monocytes Absolute: 1.4 10*3/uL — ABNORMAL HIGH (ref 0.1–1.0)
Monocytes Relative: 12 %
Neutro Abs: 9.4 10*3/uL — ABNORMAL HIGH (ref 1.7–7.7)
Neutrophils Relative %: 81 %
Platelets: 196 10*3/uL (ref 150–400)
RBC: 5.38 MIL/uL (ref 4.22–5.81)
RDW: 15.1 % (ref 11.5–15.5)
WBC: 11.7 10*3/uL — ABNORMAL HIGH (ref 4.0–10.5)
nRBC: 0 % (ref 0.0–0.2)

## 2023-04-05 LAB — BASIC METABOLIC PANEL
Anion gap: 11 (ref 5–15)
BUN: 32 mg/dL — ABNORMAL HIGH (ref 8–23)
CO2: 24 mmol/L (ref 22–32)
Calcium: 9 mg/dL (ref 8.9–10.3)
Chloride: 104 mmol/L (ref 98–111)
Creatinine, Ser: 1.87 mg/dL — ABNORMAL HIGH (ref 0.61–1.24)
GFR, Estimated: 39 mL/min — ABNORMAL LOW (ref >60)
Glucose, Bld: 157 mg/dL — ABNORMAL HIGH (ref 70–99)
Potassium: 3.9 mmol/L (ref 3.5–5.1)
Sodium: 139 mmol/L (ref 135–145)

## 2023-04-05 MED ORDER — ONDANSETRON HCL 4 MG PO TABS: 4.0000 mg | ORAL_TABLET | Freq: Four times a day (QID) | ORAL | Status: DC | PRN

## 2023-04-05 MED ORDER — MORPHINE SULFATE (PF) 2 MG/ML IV SOLN: 2.0000 mg | INTRAVENOUS | Status: DC | PRN

## 2023-04-05 MED ORDER — MORPHINE SULFATE (PF) 4 MG/ML IV SOLN
4.0000 mg | INTRAVENOUS | Status: DC | PRN
  Administered 2023-04-06: 4 mg via INTRAVENOUS
  Filled 2023-04-05: qty 1

## 2023-04-05 MED ORDER — MORPHINE SULFATE (PF) 4 MG/ML IV SOLN
4.0000 mg | Freq: Once | INTRAVENOUS | Status: DC
  Filled 2023-04-05: qty 1

## 2023-04-05 MED ORDER — ONDANSETRON HCL 4 MG/2ML IJ SOLN: 4.0000 mg | Freq: Four times a day (QID) | INTRAMUSCULAR | Status: DC | PRN

## 2023-04-05 MED ORDER — ACETAMINOPHEN 325 MG PO TABS
650.0000 mg | ORAL_TABLET | Freq: Four times a day (QID) | ORAL | Status: DC | PRN
  Administered 2023-04-06 – 2023-04-08 (×2): 650 mg via ORAL
  Filled 2023-04-05 (×2): qty 2

## 2023-04-05 MED ORDER — ENOXAPARIN SODIUM 40 MG/0.4ML IJ SOSY
40.0000 mg | PREFILLED_SYRINGE | INTRAMUSCULAR | Status: DC
  Administered 2023-04-05 – 2023-04-12 (×7): 40 mg via SUBCUTANEOUS
  Filled 2023-04-05 (×7): qty 0.4

## 2023-04-05 MED ORDER — ACETAMINOPHEN 500 MG PO TABS
1000.0000 mg | ORAL_TABLET | Freq: Once | ORAL | Status: AC
  Administered 2023-04-05: 1000 mg via ORAL
  Filled 2023-04-05: qty 2

## 2023-04-05 MED ORDER — ACETAMINOPHEN 650 MG RE SUPP
650.0000 mg | Freq: Four times a day (QID) | RECTAL | Status: DC | PRN
Start: 2023-04-05 — End: 2023-04-13

## 2023-04-05 MED ORDER — HYDROCODONE-ACETAMINOPHEN 5-325 MG PO TABS: 1.0000 | ORAL_TABLET | ORAL | Status: DC | PRN

## 2023-04-05 NOTE — Assessment & Plan Note (Signed)
 BP stable Titrate home regimen

## 2023-04-05 NOTE — ED Notes (Signed)
 Was informed that pt was NPO this AM and told pt he would not be able to eat/drink. Removed the water that came with him from the other side of the ED. While passing meds to other patients, presumably dietary brought a breakfast tray in which was noticed upon entering his room to give him a med. Pt had already eaten eggs and drank a cranberry juice. Sent a message to tell MD Baker.

## 2023-04-05 NOTE — Progress Notes (Signed)
 PT Cancellation Note  Patient Details Name: Chad Vazquez MRN: [id number] DOB: [birth year]   Cancelled Treatment:    Reason Eval/Treat Not Completed: Other (comment). Orders received and chart reviewed. Per nursing and EMR, pt pending ortho consult and surgery. PT will hold at this time and d/c current orders. Please re-consult when medically appropriate.    Delphia Grates. Fairly IV, PT, DPT Physical Therapist- [place]  [facility]  04/05/2023, 12:46 PM

## 2023-04-05 NOTE — Assessment & Plan Note (Signed)
 Cont synthroid

## 2023-04-05 NOTE — Assessment & Plan Note (Signed)
 Cr 1.9 w/ GFR 30s on presentation  Suspect this may be near baseline in review of prior renal function roughly 5 years ago  Will monitor renal function  Minimize nephrotoxic agents  Follow

## 2023-04-05 NOTE — Plan of Care (Signed)
 ?  Problem: Clinical Measurements: ?Goal: Will remain free from infection ?Outcome: Progressing ?  ?

## 2023-04-05 NOTE — Assessment & Plan Note (Addendum)
 Mechanical Fall  Noted R fibular fracture s/p mechanical fall at home (fell getting out of shower)  Dr. Audelia Acton w/ orthopedic surgery consulted for formal evaluation overnight Follow up recommendations  Pain control  PT/OT eval  Fall precautions  Follow closely

## 2023-04-05 NOTE — Consult Note (Signed)
 PODIATRY / FOOT AND ANKLE SURGERY CONSULTATION NOTE  Requesting Physician: Dr. Alvester Morin  Reason for consult: R ankle fracture  HPI: Chad Vazquez is a 68 y.o. male who presents with right ankle pain after sustaining a fall.  Patient has a history of intellectual disability and gait instability.  He has to hold onto furniture while walking around the house and per patient report he had a fall yesterday with increased ankle pain.  Patient was then brought to the hospital for further evaluation where he was noted to have a bimalleolar right ankle fracture.  As patient has intellectual disability and trouble getting around it was recommended to admit patient to the hospital for possible surgical intervention and for placement to rehab center.  History unable to be obtained from patient, no family at bedside today.  PMHx:  Past Medical History:  Diagnosis Date   Hearing difficulty of both ears    wears hearing aids   Hypertension     Surgical Hx: History reviewed. No pertinent surgical history.  FHx: History reviewed. No pertinent family history.  Social History:  reports that he has never smoked. He has never used smokeless tobacco. He reports that he does not drink alcohol and does not use drugs.  Allergies: No Known Allergies  (Not in a hospital admission)   Physical Exam: General: Alert and oriented.  No apparent distress.  Vascular: DP/PT pulses palpable bilateral, capillary fill time intact to digits bilaterally, no hair growth present to bilateral lower extremities.  Mild edema present to the right ankle with mild ecchymosis, minimal to no erythema.  Neuro: Light touch sensation appears to be intact bilateral lower extremities.  Derm: No skin openings or tears, no blisters or fracture blisters present.  MSK: Deferred range of motion and palpation testing of the right ankle, no obvious gross deformity present to the right ankle at this time.  Results for orders placed or  performed during the hospital encounter of 04/05/23 (from the past 48 hours)  CBC with Differential     Status: Abnormal   Collection Time: 04/05/23  3:30 AM  Result Value Ref Range   WBC 11.7 (H) 4.0 - 10.5 K/uL   RBC 5.38 4.22 - 5.81 MIL/uL   Hemoglobin 15.2 13.0 - 17.0 g/dL   HCT 16.1 09.6 - 04.5 %   MCV 87.0 80.0 - 100.0 fL   MCH 28.3 26.0 - 34.0 pg   MCHC 32.5 30.0 - 36.0 g/dL   RDW 40.9 81.1 - 91.4 %   Platelets 196 150 - 400 K/uL   nRBC 0.0 0.0 - 0.2 %   Neutrophils Relative % 81 %   Neutro Abs 9.4 (H) 1.7 - 7.7 K/uL   Lymphocytes Relative 7 %   Lymphs Abs 0.8 0.7 - 4.0 K/uL   Monocytes Relative 12 %   Monocytes Absolute 1.4 (H) 0.1 - 1.0 K/uL   Eosinophils Relative 0 %   Eosinophils Absolute 0.0 0.0 - 0.5 K/uL   Basophils Relative 0 %   Basophils Absolute 0.0 0.0 - 0.1 K/uL   Immature Granulocytes 0 %   Abs Immature Granulocytes 0.04 0.00 - 0.07 K/uL    Comment: Performed at [facility], [address], [place], [place] [address]  Basic metabolic panel     Status: Abnormal   Collection Time: 04/05/23  3:30 AM  Result Value Ref Range   Sodium 139 135 - 145 mmol/L   Potassium 3.9 3.5 - 5.1 mmol/L   Chloride 104 98 -  111 mmol/L   CO2 24 22 - 32 mmol/L   Glucose, Bld 157 (H) 70 - 99 mg/dL    Comment: Glucose reference range applies only to samples taken after fasting for at least 8 hours.   BUN 32 (H) 8 - 23 mg/dL   Creatinine, Ser 1.30 (H) 0.61 - 1.24 mg/dL   Calcium 9.0 8.9 - 86.5 mg/dL   GFR, Estimated 39 (L) >60 mL/min    Comment: (NOTE) Calculated using the CKD-EPI Creatinine Equation (2021)    Anion gap 11 5 - 15    Comment: Performed at [facility], [address], [place], [place] [address]   CT Head Wo Contrast Result Date: 04/05/2023 CLINICAL DATA:  Blunt trauma with ankle fracture. EXAM: CT HEAD WITHOUT CONTRAST CT CERVICAL SPINE WITHOUT CONTRAST TECHNIQUE: Multidetector CT imaging of the head and cervical spine was performed  following the standard protocol without intravenous contrast. Multiplanar CT image reconstructions of the cervical spine were also generated. RADIATION DOSE REDUCTION: This exam was performed according to the departmental dose-optimization program which includes automated exposure control, adjustment of the mA and/or kV according to patient size and/or use of iterative reconstruction technique. COMPARISON:  01/28/2007 FINDINGS: CT HEAD FINDINGS Brain: No evidence of acute infarction, hemorrhage, hydrocephalus, extra-axial collection or mass lesion/mass effect. Brain atrophy especially affecting the cerebellum with secondary ventriculomegaly. Chronic small vessel ischemia in the cerebral white matter with small lacunar infarcts at the left caudate and deep white matter. Vascular: No hyperdense vessel or unexpected calcification. Skull: Normal. Negative for fracture or focal lesion. Sinuses/Orbits: No acute finding. Retention cyst appearance in the right maxillary sinus where there is also mucosal thickening, also seen in 2008. CT CERVICAL SPINE FINDINGS Alignment: Reversal of cervical lordosis.  No listhesis. Skull base and vertebrae: No acute fracture. No primary bone lesion or focal pathologic process. Soft tissues and spinal canal: No prevertebral fluid or swelling. No visible canal hematoma. Disc levels: Degenerative endplate and facet spurring greatest at C3-4. Upper chest: No evidence of injury IMPRESSION: 1. No evidence of acute intracranial or cervical spine injury. 2. 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 Electronically Signed   By: Tiburcio Pea M.D.   On: 04/05/2023 06:07   CT Cervical Spine Wo Contrast Result Date: 04/05/2023 CLINICAL DATA:  Blunt trauma with ankle fracture. EXAM: CT HEAD WITHOUT CONTRAST CT CERVICAL SPINE WITHOUT CONTRAST TECHNIQUE: Multidetector CT imaging of the head and cervical spine was performed following the standard protocol without  intravenous contrast. Multiplanar CT image reconstructions of the cervical spine were also generated. RADIATION DOSE REDUCTION: This exam was performed according to the departmental dose-optimization program which includes automated exposure control, adjustment of the mA and/or kV according to patient size and/or use of iterative reconstruction technique. COMPARISON:  01/28/2007 FINDINGS: CT HEAD FINDINGS Brain: No evidence of acute infarction, hemorrhage, hydrocephalus, extra-axial collection or mass lesion/mass effect. Brain atrophy especially affecting the cerebellum with secondary ventriculomegaly. Chronic small vessel ischemia in the cerebral white matter with small lacunar infarcts at the left caudate and deep white matter. Vascular: No hyperdense vessel or unexpected calcification. Skull: Normal. Negative for fracture or focal lesion. Sinuses/Orbits: No acute finding. Retention cyst appearance in the right maxillary sinus where there is also mucosal thickening, also seen in 2008. CT CERVICAL SPINE FINDINGS Alignment: Reversal of cervical lordosis.  No listhesis. Skull base and vertebrae: No acute fracture. No primary bone lesion or focal pathologic process. Soft tissues and spinal canal: No prevertebral  fluid or swelling. No visible canal hematoma. Disc levels: Degenerative endplate and facet spurring greatest at C3-4. Upper chest: No evidence of injury IMPRESSION: 1. No evidence of acute intracranial or cervical spine injury. 2. 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 Electronically Signed   By: Tiburcio Pea M.D.   On: 04/05/2023 06:07   DG Ankle Complete Right Result Date: 04/05/2023 CLINICAL DATA:  Fall.  Ankle pain EXAM: RIGHT ANKLE - COMPLETE 3+ VIEW COMPARISON:  None Available. FINDINGS: Oblique distal fibular shaft fracture at and above the ankle joint with posterior displacement of 100% on the lateral view. Fragmentation at the medial malleolus as well. No ankle  mortise widening on this nonweightbearing view. Located hindfoot which also appears intact, corticated fragmentation at the peroneus ossicle. Small heel spurs. Expected soft tissue swelling. IMPRESSION: Displaced distal fibular and medial malleolus fractures. Electronically Signed   By: Tiburcio Pea M.D.   On: 04/05/2023 05:49    Blood pressure 107/67, pulse 89, temperature 99.1 F (37.3 C), temperature source Oral, resp. rate 18, height 5\' 10"  (1.778 m), weight 86.2 kg, SpO2 94%.   Assessment Right ankle bimalleolar fracture closed, displaced Possible right ankle syndesmotic and deltoid ligament injuries Intellectual disability Gait instability  Plan -Patient seen and examined -X-ray imaging reviewed and discussed with patient in detail. -Reapplied Ace wrap to the right lower extremity for compression to reduce swelling.  Recommend elevation and ice pack.  Order placed. -Patient should maintain nonweightbearing at all times the right lower extremity. All treatment options were discussed with the patient of both conservative and surgical attempts at correction including potential risks and complications.  Patient has elected for procedure consisting of right bimalleolar ankle fracture open reduction with internal fixation with possible syndesmosis and deltoid ligament repairs.  No guarantees given.  Consent obtained.  Tried to call patient's brother [name], unable to reach. -NPO order was placed this morning at 640 but order was deleted by internist when admitted.  Order was placed once again at around 8:00.  Patient was unfortunately given breakfast and ate last at around 9 AM.  Plan was to take him to surgery at 2 PM but this is now delayed the procedure to be done until 5 PM.  Will talk with anesthesia and would like them to perform a preoperative popliteal and saphenous nerve block prior to procedure. -Patient will have to be nonweightbearing for likely 6 weeks after procedure and will  likely need placement in rehab.  PT and OT will be ordered for after procedure today. -Will place postop instructions in chart after procedure and patient may follow-up after procedure 1 to 2 weeks postop in outpatient clinic.  Rosetta Posner, DPM 04/05/2023, 12:47 PM

## 2023-04-05 NOTE — ED Provider Notes (Addendum)
 [facility] Provider Note    Event Date/Time   First MD Initiated Contact with Patient 04/05/23 [phone]     (approximate)   History   Fall   HPI  Chad Vazquez is a 68 y.o. male   Past medical history of cognitive delay since birth, CKD, hypertension, here with a slip and fall from home.  His brother is his caretaker and gives collateral information given the patient's intellectual delay.  States that he was using the shower, slipped as the bath mat gave out under his feet and injured his ankle.  Unknown head strike or loss of consciousness.  Patient denies any other acute medical complaints and brother corroborates that the patient has otherwise been in his regular state of health.  However the the patient has baseline mobility problems and needs to hold onto furniture to get around the house.  Independent Historian contributed to assessment above: Brother and caretaker Chad Vazquez gives collateral information past medical history as above    Physical Exam   Triage Vital Signs: ED Triage Vitals  Encounter Vitals Group     BP 04/05/23 0327 (!) 154/98     Systolic BP Percentile --      Diastolic BP Percentile --      Pulse Rate 04/05/23 0327 88     Resp 04/05/23 0327 18     Temp 04/05/23 0327 99.2 F (37.3 C)     Temp Source 04/05/23 0327 Oral     SpO2 04/05/23 0327 94 %     Weight 04/05/23 0325 190 lb (86.2 kg)     Height 04/05/23 0325 5\' 10"  (1.778 m)     Head Circumference --      Peak Flow --      Pain Score 04/05/23 0325 10     Pain Loc --      Pain Education --      Exclude from Growth Chart --     Most recent vital signs: Vitals:   04/05/23 0327  BP: (!) 154/98  Pulse: 88  Resp: 18  Temp: 99.2 F (37.3 C)  SpO2: 94%    General: Awake, no distress.  CV:  Good peripheral perfusion.  Resp:  Normal effort.  Abd:  No distention.  Other:  Is a deformity to the right ankle tenderness to the lateral and medial aspects,  neurovascular intact.  The other extremities appear atraumatic, and the right knee and hip appear nontraumatic without deformities no tenderness to palpation.  Thorax, back, abdomen nontender to palpation.  No obvious head trauma neck supple full range of motion.  He does have some dysarthria slurred speech which is baseline per brother report.   ED Results / Procedures / Treatments   Labs (all labs ordered are listed, but only abnormal results are displayed) Labs Reviewed  CBC WITH DIFFERENTIAL/PLATELET - Abnormal; Notable for the following components:      Result Value   WBC 11.7 (*)    Neutro Abs 9.4 (*)    Monocytes Absolute 1.4 (*)    All other components within normal limits  BASIC METABOLIC PANEL - Abnormal; Notable for the following components:   Glucose, Bld 157 (*)    BUN 32 (*)    Creatinine, Ser 1.87 (*)    GFR, Estimated 39 (*)    All other components within normal limits  URINALYSIS, ROUTINE W REFLEX MICROSCOPIC     I ordered and reviewed the above labs they are notable for  white blood cell count mildly elevated 11.7.  Creatinine is elevated 1.87 compared to 2.15 in 2020 known CKD  EKG  ED ECG REPORT I, Pilar Jarvis, the attending physician, personally viewed and interpreted this ECG.   Date: 04/05/2023  EKG Time: 0335  Rate: 83  Rhythm: sinus  Axis: nl  Intervals:none  ST&T Change: no stemi    RADIOLOGY I independently reviewed and interpreted x-ray ankle and see a lateral ankle fracture fibular I also reviewed radiologist's formal read.   PROCEDURES:  Critical Care performed: No  Procedures   MEDICATIONS ORDERED IN ED: Medications  morphine (PF) 4 MG/ML injection 4 mg (4 mg Intravenous Not Given 04/05/23 0643)  acetaminophen (TYLENOL) tablet 650 mg (has no administration in time range)    Or  acetaminophen (TYLENOL) suppository 650 mg (has no administration in time range)  HYDROcodone-acetaminophen (NORCO/VICODIN) 5-325 MG per tablet 1-2 tablet  (has no administration in time range)  morphine (PF) 2 MG/ML injection 2 mg (has no administration in time range)  ondansetron (ZOFRAN) tablet 4 mg (has no administration in time range)    Or  ondansetron (ZOFRAN) injection 4 mg (has no administration in time range)  acetaminophen (TYLENOL) tablet 1,000 mg (1,000 mg Oral Given 04/05/23 2956)    External physician / consultants:  I spoke with hospital medicine for admission and regarding care plan for this patient.   IMPRESSION / MDM / ASSESSMENT AND PLAN / ED COURSE  I reviewed the triage vital signs and the nursing notes.                                Patient's presentation is most consistent with acute presentation with potential threat to life or bodily function.  Differential diagnosis includes, but is not limited to, mechanical slip and fall leading to blunt traumatic injury including ankle fracture dislocation, neurovascular injury, skull fracture ICH or C-spine injury   MDM:    Slip and fall injury leading to an ankle fracture in this patient with baseline mobility problems.  CT head and neck without emergency findings fortunately.  Patient is otherwise at his baseline according to his brother's report.  Unfortunately given his baseline mobility problems and this new ankle fracture would not be a safe discharge home.  Admission for pain control, PT OT and orthopedic consultation.   -Dr. Excell Seltzer of podiatry consulted, and patient will require surgery.  N.p.o. now.      FINAL CLINICAL IMPRESSION(S) / ED DIAGNOSES   Final diagnoses:  Fall, initial encounter  Closed fracture of right ankle, initial encounter     Rx / DC Orders   ED Discharge Orders     None        Note:  This document was prepared using Dragon voice recognition software and may include unintentional dictation errors.    Pilar Jarvis, MD 04/05/23 2130    Pilar Jarvis, MD 04/05/23 [phone]

## 2023-04-05 NOTE — ED Notes (Signed)
 Pt ABCs intact. RR even and unlabored. Pt in NAD. Bed in lowest locked position. Call bell in reach. Denies needs at this time.

## 2023-04-05 NOTE — ED Notes (Signed)
 Pt incont of urine; pt transferred onto stretcher in prep for CT scan; changed into hosp gown and attends applied; rt leg elevated on pillow and ice pack applied to rt ankle; warm blanket given to pt

## 2023-04-05 NOTE — H&P (Addendum)
 History and Physical    Patient: Chad Vazquez RKY:[id number] DOB: [birth year] DOA: 04/05/2023 DOS: the patient was seen and examined on 04/05/2023 PCP: [facility]  Patient coming from: Home  Chief Complaint:  Chief Complaint  Patient presents with   Fall   HPI: Chad Vazquez is a 68 y.o. male with medical history significant of hypertension, intellectual disability, hypothyroidism presenting with mechanical fall, right ankle fracture.  Patient with noted baseline intellectual disability.  Also with intermittent gait instability.  Has to hold onto furniture walk around house.  Per report, patient with fall, shallow.  No reported weakness or dizziness prior to event.  Fall was not witnessed.  At present, patient denies any headache.  No chest pain or shortness of breath.  Has significant right ankle pain after fall. Presented to the ER afebrile, hemodynamically stable.  Satting well on room air.  White count 11.7, hemoglobin 15.2, platelets 196, creatinine 1.9.  Glucose 157.  CT head and CT C-spine grossly stable.  Right ankle plain films with displaced distal fibular and medial malleolus fracture. Review of Systems: As mentioned in the history of present illness. All other systems reviewed and are negative. Past Medical History:  Diagnosis Date   Hearing difficulty of both ears    wears hearing aids   Hypertension    History reviewed. No pertinent surgical history. Social History:  reports that he has never smoked. He has never used smokeless tobacco. He reports that he does not drink alcohol and does not use drugs.  No Known Allergies  History reviewed. No pertinent family history.  Prior to Admission medications   Medication Sig Start Date End Date Taking? Authorizing Provider  albuterol (PROVENTIL HFA;VENTOLIN HFA) 108 (90 Base) MCG/ACT inhaler Inhale 2 puffs into the lungs every 6 (six) hours as needed for wheezing or shortness of breath. 03/09/17   Emily Filbert, MD  atorvastatin (LIPITOR) 20 MG tablet TAKE 1 TABLET BY MOUTH AT BEDTIME FOR HIGH CHOLESTEROL 04/25/17   [provider]  doxycycline (VIBRA-TABS) 100 MG tablet Take 100 mg by mouth. 07/17/16   [provider]  fluticasone (FLONASE) 50 MCG/ACT nasal spray Place 2 sprays into both nostrils daily. 10/15/14   Betancourt, Jarold Song, NP  hydrocortisone (ANUSOL-HC) 2.5 % rectal cream Place 1 application rectally 2 (two) times daily. 03/11/18   Phineas Semen, MD  levothyroxine (SYNTHROID, LEVOTHROID) 75 MCG tablet Take by mouth.    [provider]  losartan (COZAAR) 100 MG tablet Take 100 mg by mouth daily. for high blood pressure 05/10/17   [provider]  metoprolol succinate (TOPROL-XL) 100 MG 24 hr tablet Take 100 mg by mouth daily. for high blood pressure 04/02/17   [provider]  predniSONE (STERAPRED UNI-PAK 21 TAB) 10 MG (21) TBPK tablet Take by mouth daily. Dispense steroid taper pack as directed Patient not taking: Reported on 03/11/2018 03/09/17   Emily Filbert, MD  sodium chloride (OCEAN) 0.65 % SOLN nasal spray Place 2 sprays into both nostrils every 2 (two) hours while awake. 10/15/14   Barbaraann Barthel, NP    Physical Exam: Vitals:   04/05/23 0325 04/05/23 0327 04/05/23 0720  BP:  (!) 154/98 (!) 162/91  Pulse:  88 93  Resp:  18 18  Temp:  99.2 F (37.3 C) 98 F (36.7 C)  TempSrc:  Oral Oral  SpO2:  94% 96%  Weight: 86.2 kg    Height: 5\' 10"  (1.778 m)  Physical Exam Constitutional:      Appearance: He is obese.  HENT:     Head: Normocephalic and atraumatic.     Nose: Nose normal.     Mouth/Throat:     Mouth: Mucous membranes are moist.  Eyes:     Pupils: Pupils are equal, round, and reactive to light.  Cardiovascular:     Rate and Rhythm: Normal rate and regular rhythm.  Pulmonary:     Effort: Pulmonary effort is normal.  Abdominal:     General: Bowel sounds are normal.  Musculoskeletal:     Comments: R ankle  swollen and externally rotated.   Skin:    General: Skin is warm.  Neurological:     General: No focal deficit present.     Data Reviewed:  There are no new results to review at this time.  DG Ankle Complete Right CLINICAL DATA:  Fall.  Ankle pain  EXAM: RIGHT ANKLE - COMPLETE 3+ VIEW  COMPARISON:  None Available.  FINDINGS: Oblique distal fibular shaft fracture at and above the ankle joint with posterior displacement of 100% on the lateral view. Fragmentation at the medial malleolus as well. No ankle mortise widening on this nonweightbearing view. Located hindfoot which also appears intact, corticated fragmentation at the peroneus ossicle.  Small heel spurs.  Expected soft tissue swelling.  IMPRESSION: Displaced distal fibular and medial malleolus fractures.  Electronically Signed   By: Tiburcio Pea M.D.   On: 04/05/2023 05:49  Last metabolic panel Lab Results  Component Value Date   GLUCOSE 157 (H) 04/05/2023   NA 139 04/05/2023   K 3.9 04/05/2023   CL 104 04/05/2023   CO2 24 04/05/2023   BUN 32 (H) 04/05/2023   CREATININE 1.87 (H) 04/05/2023   GFRNONAA 39 (L) 04/05/2023   CALCIUM 9.0 04/05/2023   PROT 6.4 (L) 03/11/2018   ALBUMIN 3.6 03/11/2018   BILITOT 0.8 03/11/2018   ALKPHOS 73 03/11/2018   AST 18 03/11/2018   ALT 21 03/11/2018   ANIONGAP 11 04/05/2023   Lab Results  Component Value Date   WBC 11.7 (H) 04/05/2023   HGB 15.2 04/05/2023   HCT 46.8 04/05/2023   MCV 87.0 04/05/2023   PLT 196 04/05/2023    Assessment and Plan: * Ankle fracture Mechanical Fall  Noted R fibular fracture s/p mechanical fall at home (fell getting out of shower)  Dr. Audelia Acton w/ orthopedic surgery consulted for formal evaluation overnight Follow up recommendations  Pain control  PT/OT eval  Fall precautions  Follow closely    CKD (chronic kidney disease) Cr 1.9 w/ GFR 30s on presentation  Suspect this may be near baseline in review of prior renal  function roughly 5 years ago  Will monitor renal function  Minimize nephrotoxic agents  Follow  Hypothyroidism, unspecified Cont synthroid    HTN (hypertension) BP stable  Titrate home regimen        Advance Care Planning:   Code Status: Full Code   Consults: Orthopedic Surgery   Family Communication: No family at the bedside. Attempted to call brother Chad Vazquez at [phone] with phone call unable to be connected.   Severity of Illness: The appropriate patient status for this patient is INPATIENT. Inpatient status is judged to be reasonable and necessary in order to provide the required intensity of service to ensure the patient's safety. The patient's presenting symptoms, physical exam findings, and initial radiographic and laboratory data in the context of their chronic comorbidities is felt to place them  at high risk for further clinical deterioration. Furthermore, it is not anticipated that the patient will be medically stable for discharge from the hospital within 2 midnights of admission.   * I certify that at the point of admission it is my clinical judgment that the patient will require inpatient hospital care spanning beyond 2 midnights from the point of admission due to high intensity of service, high risk for further deterioration and high frequency of surveillance required.*  Author: Floydene Flock, MD 04/05/2023 7:42 AM  For on call review [email or web address].

## 2023-04-05 NOTE — Progress Notes (Signed)
 OT Cancellation Note  Patient Details Name: Chad Vazquez MRN: [id number] DOB: [birth year]   Cancelled Treatment:    Reason Eval/Treat Not Completed: Other (comment) (per discussion with nurse, pending formal ortho eval, is NPO for possible surgery. OT will hold at this time, will need new orders if pt goes for surgery.) Oleta Mouse, OTD OTR/L  04/05/23, 11:15 AM

## 2023-04-05 NOTE — ED Triage Notes (Addendum)
 EMS brings pt in for increasing weakness; 2 falls tonight, c/o rt ankle pain; pt's brother is primary caregiver; st last night pt was taking a shower and he went to check on him and found him lying on the floor; st possible may have hit his head as he was wedged between the shower and toilet and had to call a neighbor for assistance to get him up; around 2am pt again fell getting OOB; brother denies any recent illness and denies hx of falls (fyi...the pt's brother is enroute to ED via EMS also to be evaluated; apparently reports he is the legal guardian but has no paper work--will inform brother to bring paperwork when accessible)

## 2023-04-05 NOTE — ED Triage Notes (Addendum)
 Pt to ED via EMS from home, pt states he tripped over the vacuum tonight injuring his right ankle., Pt denies weakness. Denies hitting head, is not on blood thinner. Per EMS pt fell a second time and isnt sure how he fell the 2nd time.

## 2023-04-05 NOTE — Progress Notes (Signed)
 OT Cancellation Note  Patient Details Name: Chad Vazquez MRN: [id number] DOB: 1955/08/31   Cancelled Treatment:    Reason Eval/Treat Not Completed: Other (comment) (per chart pt is scheduled for surgery today. Will require new OT orders after surgery. Thank you.)  Oleta Mouse, OTD OTR/L  04/05/23, 1:50 PM

## 2023-04-05 NOTE — Progress Notes (Signed)
 Applied well padded compression posterior splint to the RLE.  Patient to remain NWB to the RLE.  Will hold off on surgery until proper consent is obtained.  PT/OT may work with patient on NWB with crutches, wheel chair, knee scooter if possible.  Will reassess and plan for surgery sometime after consent is obtained.

## 2023-04-05 NOTE — Progress Notes (Signed)
 Both myself as well as Dr. Excell Seltzer have tried multiple times to contact patient's brother Katina Degree main at [phone]. Nursing has also been unsuccessful in reaching him Surgery has been delayed secondary to inability to obtain consent Social worker has been consulted to address this issue.

## 2023-04-06 DIAGNOSIS — S82841A Displaced bimalleolar fracture of right lower leg, initial encounter for closed fracture: Secondary | ICD-10-CM | POA: Diagnosis not present

## 2023-04-06 DIAGNOSIS — S82891A Other fracture of right lower leg, initial encounter for closed fracture: Secondary | ICD-10-CM | POA: Diagnosis not present

## 2023-04-06 LAB — URINALYSIS, ROUTINE W REFLEX MICROSCOPIC
Bilirubin Urine: NEGATIVE
Glucose, UA: >=500 mg/dL — AB
Ketones, ur: NEGATIVE mg/dL
Leukocytes,Ua: NEGATIVE
Nitrite: NEGATIVE
Protein, ur: 30 mg/dL — AB
Specific Gravity, Urine: 1.022 (ref 1.005–1.030)
Squamous Epithelial / HPF: 0 /HPF (ref 0–5)
pH: 6 (ref 5.0–8.0)

## 2023-04-06 LAB — COMPREHENSIVE METABOLIC PANEL
ALT: 23 U/L (ref 0–44)
AST: 23 U/L (ref 15–41)
Albumin: 3.1 g/dL — ABNORMAL LOW (ref 3.5–5.0)
Alkaline Phosphatase: 73 U/L (ref 38–126)
Anion gap: 6 (ref 5–15)
BUN: 38 mg/dL — ABNORMAL HIGH (ref 8–23)
CO2: 27 mmol/L (ref 22–32)
Calcium: 8.7 mg/dL — ABNORMAL LOW (ref 8.9–10.3)
Chloride: 108 mmol/L (ref 98–111)
Creatinine, Ser: 1.9 mg/dL — ABNORMAL HIGH (ref 0.61–1.24)
GFR, Estimated: 38 mL/min — ABNORMAL LOW (ref >60)
Glucose, Bld: 133 mg/dL — ABNORMAL HIGH (ref 70–99)
Potassium: 4.2 mmol/L (ref 3.5–5.1)
Sodium: 141 mmol/L (ref 135–145)
Total Bilirubin: 1 mg/dL (ref 0.0–1.2)
Total Protein: 6.6 g/dL (ref 6.5–8.1)

## 2023-04-06 LAB — CBC
HCT: 43.1 % (ref 39.0–52.0)
Hemoglobin: 14.1 g/dL (ref 13.0–17.0)
MCH: 28.6 pg (ref 26.0–34.0)
MCHC: 32.7 g/dL (ref 30.0–36.0)
MCV: 87.4 fL (ref 80.0–100.0)
Platelets: 181 10*3/uL (ref 150–400)
RBC: 4.93 MIL/uL (ref 4.22–5.81)
RDW: 15.7 % — ABNORMAL HIGH (ref 11.5–15.5)
WBC: 8.8 10*3/uL (ref 4.0–10.5)
nRBC: 0 % (ref 0.0–0.2)

## 2023-04-06 LAB — MAGNESIUM: Magnesium: 2.3 mg/dL (ref 1.7–2.4)

## 2023-04-06 LAB — VITAMIN B12: Vitamin B-12: 246 pg/mL (ref 180–914)

## 2023-04-06 LAB — PHOSPHORUS: Phosphorus: 3.6 mg/dL (ref 2.5–4.6)

## 2023-04-06 LAB — VITAMIN D 25 HYDROXY (VIT D DEFICIENCY, FRACTURES): Vit D, 25-Hydroxy: 36.27 ng/mL (ref 30–100)

## 2023-04-06 LAB — CK: Total CK: 163 U/L (ref 49–397)

## 2023-04-06 LAB — HIV ANTIBODY (ROUTINE TESTING W REFLEX): HIV Screen 4th Generation wRfx: NONREACTIVE

## 2023-04-06 MED ORDER — ALBUTEROL SULFATE (2.5 MG/3ML) 0.083% IN NEBU: 2.5000 mg | INHALATION_SOLUTION | Freq: Four times a day (QID) | RESPIRATORY_TRACT | Status: DC | PRN

## 2023-04-06 MED ORDER — ACETAMINOPHEN 10 MG/ML IV SOLN
1000.0000 mg | Freq: Once | INTRAVENOUS | Status: AC
  Administered 2023-04-06: 1000 mg via INTRAVENOUS
  Filled 2023-04-06: qty 100

## 2023-04-06 MED ORDER — OXYCODONE HCL 5 MG PO TABS
5.0000 mg | ORAL_TABLET | Freq: Four times a day (QID) | ORAL | Status: DC | PRN
  Administered 2023-04-08 – 2023-04-12 (×7): 5 mg via ORAL
  Filled 2023-04-06 (×8): qty 1

## 2023-04-06 MED ORDER — MORPHINE SULFATE (PF) 4 MG/ML IV SOLN
4.0000 mg | INTRAVENOUS | Status: DC | PRN
  Administered 2023-04-09 – 2023-04-10 (×3): 4 mg via INTRAVENOUS
  Filled 2023-04-06 (×3): qty 1

## 2023-04-06 MED ORDER — LEVOTHYROXINE SODIUM 75 MCG PO TABS
75.0000 ug | ORAL_TABLET | Freq: Every day | ORAL | Status: DC
  Administered 2023-04-06 – 2023-04-12 (×6): 75 ug via ORAL
  Filled 2023-04-06: qty 3
  Filled 2023-04-06 (×4): qty 1
  Filled 2023-04-06: qty 3
  Filled 2023-04-06 (×2): qty 1
  Filled 2023-04-06: qty 3
  Filled 2023-04-06: qty 1
  Filled 2023-04-06 (×2): qty 3

## 2023-04-06 NOTE — Evaluation (Signed)
 Physical Therapy Evaluation Patient Details Name: Chad Vazquez MRN: [id number] DOB: [birth year] Today's Date: 04/06/2023  History of Present Illness  Chad Vazquez is a 68 y.o. male with medical history significant of hypertension, intellectual disability, CKD, hypothyroidism who presents with right ankle pain after sustaining 2 falls.  Patient has a history of intellectual disability and gait instability. Patient was then brought to the hospital for further evaluation where he was noted to have a bimalleolar right ankle fracture.  As patient has intellectual disability and trouble getting around it was recommended to admit patient to the hospital for possible surgical intervention and for placement to rehab center.  Surgery then held due to inability to get proper consent from patient's brother [name] (unable to reach) who is is legal guardian. Podiatrist gave permission to PT/OT to work with patient NWB with crutches, wheelchair, knee scooter if possible. Plan to re-assess and plan for surgery after consent is obtained.   Clinical Impression  Patient received asleep in bed. He was agreeable to PT evaluation and alert once he was awake. Patient has a history of intellectual disability and was oriented only to self. He had difficulty with communication due to unclear speech and hearing impairment (wearing hearing aide). History obtained from chart review. He lives with his brother [name] who is his legal guardian and primary caregiver. Patient usually holds onto furniture and walls to ambulate. He had two falls on 2/27 which resulted in a fractured right ankle, but Danny denied falls prior to that in the last 6 months. Upon PT evaluation, patient was unable to consistently follow commands and had deficits in motor reasoning so that he grabbed onto things that prevented mobility instead of helping and was unable to shift forwards or follow NWB precautions during mobility. He required Total A to boost up the  bed, Max A for supine <> sit, and Max A +2 for sit <> stand to RW at edge of bed. He stood for about 10 seconds but was unable to stop attempting to put weight through R LE (that limb was managed by 2nd person to maintain WB precautions), so he was assisted back to bed. To transfer to chair safely, he would likely need +3 assist or hoyer lift at this point. Patient has experienced a decline in functional independence and mobility. Patient would benefit from skilled physical therapy to address impairments and functional limitations (see PT Problem List below) to work towards stated goals and return to PLOF or maximal functional independence.        If plan is discharge home, recommend the following: Two people to help with walking and/or transfers;Two people to help with bathing/dressing/bathroom;Assist for transportation;Help with stairs or ramp for entrance;Direct supervision/assist for medications management;Direct supervision/assist for financial management;Assistance with cooking/housework;Supervision due to cognitive status   Can travel by private vehicle   No    Equipment Recommendations Other (comment) (to be determined in next venue of care)  Recommendations for Other Services       Functional Status Assessment Patient has had a recent decline in their functional status and demonstrates the ability to make significant improvements in function in a reasonable and predictable amount of time.     Precautions / Restrictions Precautions Precautions: Fall Recall of Precautions/Restrictions: Impaired Required Braces or Orthoses: Splint/Cast Splint/Cast: compression posterior splint R ankle (already in place) Restrictions Weight Bearing Restrictions Per Provider Order: Yes RLE Weight Bearing Per Provider Order: Non weight bearing Other Position/Activity Restrictions: may use knee scooter if able  per DPM      Mobility  Bed Mobility Overal bed mobility: Needs Assistance Bed Mobility:  Rolling, Sidelying to Sit, Sit to Supine Rolling: Max assist Sidelying to sit: Mod assist   Sit to supine: Max assist   General bed mobility comments: Patient was able to initiate movement for rolling to go supine to sit but was unable to problem solve on where to put hands and actually was preventing himself from moving until PT assisted him with moving his hands away from the bedrails and provided max A for rolling. He was able to help a little more with sidelying to sit, but required support at the trunk and B LE to go sit to supine. He was total A with draw sheet for scooting up in bed.    Transfers Overall transfer level: Needs assistance Equipment used: Rolling walker (2 wheels) Transfers: Sit to/from Stand Sit to Stand: +2 physical assistance, Max assist           General transfer comment: Patient performed sit <> stand at edge of bed with Max A+2 (one person holding R LE off floor to maintain WB precautions and second person performin the rest of the transfer with patient). Patient unable to scoot to edge of bed without max A.  Needs hand over hand cuing for safe placement of hands on walker and attempts to grab walker in inappropriate places. Patient lacks ability to follow cues to shift forwards to get weight over left LE. Was not safe for pivot transfer to chair at this time.    Ambulation/Gait               General Gait Details: unable  Stairs            Wheelchair Mobility     Tilt Bed    Modified Rankin (Stroke Patients Only)       Balance Overall balance assessment: Needs assistance, History of Falls Sitting-balance support: Bilateral upper extremity supported Sitting balance-Leahy Scale: Fair Sitting balance - Comments: Patient needed Max A to do supine <> sit transfer but was able to maintain steady edge of bed sitting.   Standing balance support: Bilateral upper extremity supported, During functional activity, Reliant on assistive device for  balance Standing balance-Leahy Scale: Poor Standing balance comment: Patient required mod A +2 to maintain standing balance at edge of bed with B UE support on RW. One person assisted by holding his right LE off the floor to maintain weightbearing precautions, but patient continued to attempt to put weight through it.                             Pertinent Vitals/Pain Pain Assessment Pain Assessment: Faces Faces Pain Scale: No hurt    Home Living Family/patient expects to be discharged to:: Private residence Living Arrangements: Other relatives Available Help at Discharge: Family               Additional Comments: Per chart review patient lives with his brother [name] who is his legal guardian and primary caregiver.    Prior Function Prior Level of Function : Patient poor historian/Family not available;History of Falls (last six months) (Patient with intellectual disability and unreliable historian)             Mobility Comments: Per chart review he was having gait instability at times and holds furnature to get around the home; He fell twice in one day leading to the fracture  and hospitalization but brother said no other falls in the last 6 months. Patient states he uses a walker but no mention of this in chart review.       Extremity/Trunk Assessment   Upper Extremity Assessment Upper Extremity Assessment: Generalized weakness    Lower Extremity Assessment Lower Extremity Assessment: RLE deficits/detail;LLE deficits/detail RLE Deficits / Details: NWB on R LE but attempts to put weight through it. Unable to perform active SLR on right or perform right heel slide in supine. RLE: Unable to fully assess due to immobilization LLE Deficits / Details: Unable to perform active SLR on left but able to do heel slide to flex L knee.    Cervical / Trunk Assessment Cervical / Trunk Assessment: Normal  Communication   Communication Communication: Impaired Factors  Affecting Communication: Hearing impaired;Reduced clarity of speech    Cognition Arousal: Alert Behavior During Therapy: WFL for tasks assessed/performed, Flat affect   PT - Cognitive impairments: History of cognitive impairments, Awareness, Safety/Judgement, Problem solving, Sequencing, Initiation, Attention, Memory, Orientation                       PT - Cognition Comments: Pt able to state his first name only. Has known intellectual disability. Following commands: Impaired Following commands impaired: Follows one step commands inconsistently     Cueing Cueing Techniques: Verbal cues, Gestural cues, Tactile cues, Visual cues     General Comments General comments (skin integrity, edema, etc.): Vitals WFL during session    Exercises Other Exercises Other Exercises: patient oriented to location and situation, patient educated on discharge reccomendations and safe mobility techniques   Assessment/Plan    PT Assessment Patient needs continued PT services  PT Problem List Decreased strength;Decreased mobility;Decreased safety awareness;Decreased range of motion;Decreased knowledge of precautions;Decreased coordination;Obesity;Decreased activity tolerance;Decreased cognition;Decreased balance;Decreased knowledge of use of DME       PT Treatment Interventions DME instruction;Gait training;Stair training;Functional mobility training;Therapeutic activities;Patient/family education;Cognitive remediation;Neuromuscular re-education;Balance training;Therapeutic exercise    PT Goals (Current goals can be found in the Care Plan section)  Acute Rehab PT Goals PT Goal Formulation: Patient unable to participate in goal setting    Frequency Min 1X/week     Co-evaluation               AM-PAC PT "6 Clicks" Mobility  Outcome Measure Help needed turning from your back to your side while in a flat bed without using bedrails?: A Lot Help needed moving from lying on your back to  sitting on the side of a flat bed without using bedrails?: A Lot Help needed moving to and from a bed to a chair (including a wheelchair)?: Total Help needed standing up from a chair using your arms (e.g., wheelchair or bedside chair)?: A Lot Help needed to walk in hospital room?: Total Help needed climbing 3-5 steps with a railing? : Total 6 Click Score: 9    End of Session Equipment Utilized During Treatment: Gait belt;Other (comment) (right ankle splint) Activity Tolerance: Patient tolerated treatment well Patient left: in bed;with call bell/phone within reach;with bed alarm set Nurse Communication: Mobility status PT Visit Diagnosis: Other abnormalities of gait and mobility (R26.89);Muscle weakness (generalized) (M62.81);History of falling (Z91.81)    Time: 4098-1191 PT Time Calculation (min) (ACUTE ONLY): 30 min   Charges:   PT Evaluation $PT Eval Moderate Complexity: 1 Mod   PT General Charges $$ ACUTE PT VISIT: 1 Visit         Huntley Dec R. Ilsa Iha, PT,  DPT 04/06/23, 9:51 AM  [facility] [address] [place], [place] [address] P: [phone] I F: [phone]

## 2023-04-06 NOTE — Progress Notes (Signed)
 [facility] Progress Note  Patient: Chad Vazquez    ZOX:[id number]  DOA: 04/05/2023     Date of Service: the patient was seen and examined on 04/06/2023  Chief Complaint  Patient presents with   Fall   Brief hospital course: Chad Vazquez is a 68 y.o. male with medical history significant of hypertension, intellectual disability, hypothyroidism presenting with mechanical fall, right ankle fracture.  Patient with noted baseline intellectual disability.  Also with intermittent gait instability.  Has to hold onto furniture walk around house.  Per report, patient with fall, shallow.  No reported weakness or dizziness prior to event.  Fall was not witnessed.  At present, patient denies any headache.  No chest pain or shortness of breath.  Has significant right ankle pain after fall. Presented to the ER afebrile, hemodynamically stable.  Satting well on room air.  White count 11.7, hemoglobin 15.2, platelets 196, creatinine 1.9.  Glucose 157.  CT head and CT C-spine grossly stable.  Right ankle plain films with displaced distal fibular and medial malleolus fracture.   Assessment and Plan:  # Ankle fracture Mechanical Fall  Noted R fibular fracture s/p mechanical fall at home (fell getting out of shower)  Dr. Audelia Acton w/ orthopedic surgery consulted for formal evaluation overnight Follow up recommendations  Pain control  PT/OT eval  Fall precautions  Follow closely       CKD (chronic kidney disease) Cr 1.9 w/ GFR 30s on presentation  Suspect this may be near baseline in review of prior renal function roughly 5 years ago  Will monitor renal function  Minimize nephrotoxic agents  Follow   Hypothyroidism, unspecified Cont synthroid      HTN (hypertension) BP stable  Titrate home regimen     Body mass index is 27.26 kg/m.  Interventions:  Diet: Regular diet DVT Prophylaxis: Subcutaneous Lovenox   Advance goals of care discussion: Full code  Family Communication:  family was not present at bedside, at the time of interview.  Patient is AAO x 0 Called patient's brother but no answer We will try to reach out again tomorrow a.m.   Disposition:  Pt is from Home, admitted with Fall and right ankle fracture, scheduled for surgery tomorrow a.m. 3-25, which precludes a safe discharge. Discharge to SNF, when cleared by podiatry.  Subjective: No significant events overnight, patient was pointing to right ankle, he is AO x 0, has intellectual disability, unable to communicate and offer any complaints.  Seems to be resting comfortably without any acute distress  Physical Exam: General: NAD, lying comfortably Appear in no distress, affect appropriate Eyes: PERRLA ENT: Oral Mucosa Clear, moist  Neck: no JVD,  Cardiovascular: S1 and S2 Present, no Murmur,  Respiratory: good respiratory effort, Bilateral Air entry equal and Decreased, no Crackles, no wheezes Abdomen: Bowel Sound present, Soft and no tenderness,  Skin: no rashes Extremities: Right ankle splint intact, LLE No pedal edema, no calf tenderness Neurologic: without any new focal findings Gait not checked due to patient safety concerns  Vitals:   04/05/23 1303 04/05/23 1548 04/05/23 2119 04/06/23 0809  BP: (!) 141/90 125/78 (!) 132/112 100/64  Pulse: 88 84 91 85  Resp: 17 16 18 16   Temp: 98 F (36.7 C) 98.2 F (36.8 C) 98.1 F (36.7 C) (!) 97.5 F (36.4 C)  TempSrc:  Oral Oral   SpO2: 96% 100% 96% 94%  Weight:      Height:        Intake/Output Summary (Last  24 hours) at 04/06/2023 1615 Last data filed at 04/06/2023 0316 Gross per 24 hour  Intake 100 ml  Output --  Net 100 ml   Filed Weights   04/05/23 0325  Weight: 86.2 kg    Data Reviewed: I have personally reviewed and interpreted daily labs, tele strips, imagings as discussed above. I reviewed all nursing notes, pharmacy notes, vitals, pertinent old records I have discussed plan of care as described above with RN and  patient/family.  CBC: Recent Labs  Lab 04/05/23 0330 04/06/23 0702  WBC 11.7* 8.8  NEUTROABS 9.4*  --   HGB 15.2 14.1  HCT 46.8 43.1  MCV 87.0 87.4  PLT 196 181   Basic Metabolic Panel: Recent Labs  Lab 04/05/23 0330 04/06/23 0702  NA 139 141  K 3.9 4.2  CL 104 108  CO2 24 27  GLUCOSE 157* 133*  BUN 32* 38*  CREATININE 1.87* 1.90*  CALCIUM 9.0 8.7*  MG  --  2.3  PHOS  --  3.6    Studies: No results found.  Scheduled Meds:  enoxaparin (LOVENOX) injection  40 mg Subcutaneous Q24H   levothyroxine  75 mcg Oral Q0600    morphine injection  4 mg Intravenous Once   Continuous Infusions: PRN Meds: acetaminophen **OR** acetaminophen, albuterol, morphine injection, ondansetron **OR** ondansetron (ZOFRAN) IV, oxyCODONE  Time spent: 35 minutes  Author: Gillis Santa. MD [facility] 04/06/2023 4:15 PM  To reach On-call, see care teams to locate the attending and reach out to them via [email or web address]. If 7PM-7AM, please contact night-coverage If you still have difficulty reaching the attending provider, please page the [facility] (Director on Call) for [facility] on amion for assistance.

## 2023-04-06 NOTE — Progress Notes (Addendum)
 Contacted brother [name] and relative [place].  All treatment options were discussed with the patient and family of both conservative and surgical attempts at correction including potential risks and complications.  Patient has elected for procedure consisting of right ankle fracture ORIF with possible syndesmotic and deltoid ligament repairs.  No guarantees given.  Consent obtained over the phone.  Plan for surgery tomorrow at 0900.  NPO at midnight tonight.  Patient will need placement in outpatient rehab after procedure.  Will likely have to maintain NWB status for 4-6 weeks after surgery depending on procedure.

## 2023-04-06 NOTE — Care Management Obs Status (Signed)
 MEDICARE OBSERVATION STATUS NOTIFICATION   Patient Details  Name: Chad Vazquez MRN: [id number] Date of Birth: [birth year]   Medicare Observation Status Notification Given:  Orland Dec, CMA 04/06/2023, 9:34 AM

## 2023-04-06 NOTE — Anesthesia Preprocedure Evaluation (Addendum)
 Anesthesia Evaluation  Patient identified by MRN, date of birth, ID band Patient confused  General Assessment Comment:Phone consent was obtained from [facility], brother, on 04/06/2023 by Nelta Numbers MD. Pt is alert but does not answer orientations questions directly. He follows commands.   Reviewed: H&P   History of Anesthesia Complications Negative for: history of anesthetic complications  Airway Mallampati: III  TM Distance: >3 FB Neck ROM: full    Dental  (+) Lower Dentures, Upper Dentures   Pulmonary neg shortness of breath, Recent URI    Pulmonary exam normal        Cardiovascular hypertension, (-) angina Normal cardiovascular exam     Neuro/Psych        intellectual disabilitynegative neurological ROS     GI/Hepatic negative GI ROS, Neg liver ROS,,,  Endo/Other  Hypothyroidism    Renal/GU Renal InsufficiencyRenal disease  negative genitourinary   Musculoskeletal   Abdominal Normal abdominal exam  (+)   Peds  Hematology negative hematology ROS (+)   Anesthesia Other Findings Past Medical History: No date: Hearing difficulty of both ears     Comment:  wears hearing aids No date: Hypertension   BMI    Body Mass Index: 27.26 kg/m      Reproductive/Obstetrics negative OB ROS                             Anesthesia Physical Anesthesia Plan  ASA: 3  Anesthesia Plan: General ETT   Post-op Pain Management: Regional block*   Induction: Intravenous  PONV Risk Score and Plan:   Airway Management Planned: Oral ETT  Additional Equipment:   Intra-op Plan:   Post-operative Plan: Extubation in OR  Informed Consent: I have reviewed the patients History and Physical, chart, labs and discussed the procedure including the risks, benefits and alternatives for the proposed anesthesia with the patient or authorized representative who has indicated his/her understanding and  acceptance.     Dental Advisory Given and Consent reviewed with POA  Plan Discussed with: CRNA and Surgeon  Anesthesia Plan Comments: (Patient POA consented, by Dr. Laural Benes, for risks of anesthesia including but not limited to:  - adverse reactions to medications - damage to eyes, teeth, lips or other oral mucosa - nerve damage due to positioning  - sore throat or hoarseness - Damage to heart, brain, nerves, lungs, other parts of body or loss of life  They voiced understanding and assent.)        Anesthesia Quick Evaluation

## 2023-04-07 ENCOUNTER — Observation Stay: Payer: Self-pay | Admitting: Anesthesiology

## 2023-04-07 ENCOUNTER — Other Ambulatory Visit: Payer: Self-pay

## 2023-04-07 ENCOUNTER — Observation Stay

## 2023-04-07 ENCOUNTER — Encounter
Admission: EM | Disposition: A | Discharge status: Skilled Nursing Facility | Payer: Self-pay | Source: Home / Self Care | Attending: Emergency Medicine

## 2023-04-07 DIAGNOSIS — S82891A Other fracture of right lower leg, initial encounter for closed fracture: Secondary | ICD-10-CM | POA: Diagnosis not present

## 2023-04-07 DIAGNOSIS — S82841A Displaced bimalleolar fracture of right lower leg, initial encounter for closed fracture: Secondary | ICD-10-CM | POA: Diagnosis not present

## 2023-04-07 HISTORY — PX: ORIF ANKLE FRACTURE: SHX5408

## 2023-04-07 LAB — BASIC METABOLIC PANEL
Anion gap: 6 (ref 5–15)
BUN: 41 mg/dL — ABNORMAL HIGH (ref 8–23)
CO2: 28 mmol/L (ref 22–32)
Calcium: 8.4 mg/dL — ABNORMAL LOW (ref 8.9–10.3)
Chloride: 106 mmol/L (ref 98–111)
Creatinine, Ser: 1.78 mg/dL — ABNORMAL HIGH (ref 0.61–1.24)
GFR, Estimated: 41 mL/min — ABNORMAL LOW (ref >60)
Glucose, Bld: 120 mg/dL — ABNORMAL HIGH (ref 70–99)
Potassium: 3.6 mmol/L (ref 3.5–5.1)
Sodium: 140 mmol/L (ref 135–145)

## 2023-04-07 LAB — PHOSPHORUS: Phosphorus: 3.2 mg/dL (ref 2.5–4.6)

## 2023-04-07 LAB — CBC
HCT: 41.5 % (ref 39.0–52.0)
Hemoglobin: 13.5 g/dL (ref 13.0–17.0)
MCH: 28.4 pg (ref 26.0–34.0)
MCHC: 32.5 g/dL (ref 30.0–36.0)
MCV: 87.4 fL (ref 80.0–100.0)
Platelets: 165 10*3/uL (ref 150–400)
RBC: 4.75 MIL/uL (ref 4.22–5.81)
RDW: 15.6 % — ABNORMAL HIGH (ref 11.5–15.5)
WBC: 8.1 10*3/uL (ref 4.0–10.5)
nRBC: 0 % (ref 0.0–0.2)

## 2023-04-07 LAB — MAGNESIUM: Magnesium: 2.3 mg/dL (ref 1.7–2.4)

## 2023-04-07 SURGERY — OPEN REDUCTION INTERNAL FIXATION (ORIF) ANKLE FRACTURE
Anesthesia: General | Site: Ankle | Laterality: Right

## 2023-04-07 MED ORDER — CYANOCOBALAMIN 1000 MCG/ML IJ SOLN
1000.0000 ug | Freq: Every day | INTRAMUSCULAR | Status: AC
  Administered 2023-04-07 – 2023-04-12 (×6): 1000 ug via INTRAMUSCULAR
  Filled 2023-04-07 (×6): qty 1

## 2023-04-07 MED ORDER — CEFAZOLIN SODIUM-DEXTROSE 2-4 GM/100ML-% IV SOLN
2.0000 g | INTRAVENOUS | Status: AC
  Administered 2023-04-07: 2 g via INTRAVENOUS

## 2023-04-07 MED ORDER — CYANOCOBALAMIN 1000 MCG/ML IJ SOLN
1000.0000 ug | Freq: Every day | INTRAMUSCULAR | Status: DC
  Filled 2023-04-07: qty 1

## 2023-04-07 MED ORDER — DEXMEDETOMIDINE HCL IN NACL 80 MCG/20ML IV SOLN
INTRAVENOUS | Status: DC | PRN
  Administered 2023-04-07: 8 ug via INTRAVENOUS

## 2023-04-07 MED ORDER — ONDANSETRON HCL 4 MG/2ML IJ SOLN
INTRAMUSCULAR | Status: AC
  Filled 2023-04-07: qty 2

## 2023-04-07 MED ORDER — LIDOCAINE HCL (CARDIAC) PF 100 MG/5ML IV SOSY
PREFILLED_SYRINGE | INTRAVENOUS | Status: DC | PRN
  Administered 2023-04-07: 80 mg via INTRAVENOUS

## 2023-04-07 MED ORDER — ROCURONIUM BROMIDE 100 MG/10ML IV SOLN
INTRAVENOUS | Status: DC | PRN
  Administered 2023-04-07: 20 mg via INTRAVENOUS
  Administered 2023-04-07: 50 mg via INTRAVENOUS

## 2023-04-07 MED ORDER — ACETAMINOPHEN 10 MG/ML IV SOLN
INTRAVENOUS | Status: AC
  Filled 2023-04-07: qty 100

## 2023-04-07 MED ORDER — FENTANYL CITRATE (PF) 100 MCG/2ML IJ SOLN
INTRAMUSCULAR | Status: DC | PRN
  Administered 2023-04-07 (×3): 50 ug via INTRAVENOUS

## 2023-04-07 MED ORDER — ONDANSETRON HCL 4 MG/2ML IJ SOLN
INTRAMUSCULAR | Status: DC | PRN
  Administered 2023-04-07: 4 mg via INTRAVENOUS

## 2023-04-07 MED ORDER — FENTANYL CITRATE (PF) 100 MCG/2ML IJ SOLN: 25.0000 ug | INTRAMUSCULAR | Status: DC | PRN

## 2023-04-07 MED ORDER — DEXAMETHASONE SODIUM PHOSPHATE 10 MG/ML IJ SOLN
INTRAMUSCULAR | Status: DC | PRN
  Administered 2023-04-07: 10 mg via INTRAVENOUS

## 2023-04-07 MED ORDER — BUPIVACAINE HCL (PF) 0.5 % IJ SOLN
INTRAMUSCULAR | Status: DC | PRN
  Administered 2023-04-07: 20 mL

## 2023-04-07 MED ORDER — OXYCODONE HCL 5 MG PO TABS: 5.0000 mg | ORAL_TABLET | Freq: Once | ORAL | Status: DC | PRN

## 2023-04-07 MED ORDER — PROPOFOL 10 MG/ML IV BOLUS
INTRAVENOUS | Status: AC
  Filled 2023-04-07: qty 20

## 2023-04-07 MED ORDER — VITAMIN B-12 1000 MCG PO TABS: 1000.0000 ug | ORAL_TABLET | Freq: Every day | ORAL | Status: DC

## 2023-04-07 MED ORDER — VITAMIN D (ERGOCALCIFEROL) 1.25 MG (50000 UNIT) PO CAPS
50000.0000 [IU] | ORAL_CAPSULE | ORAL | Status: DC
  Filled 2023-04-07: qty 1

## 2023-04-07 MED ORDER — DEXMEDETOMIDINE HCL IN NACL 80 MCG/20ML IV SOLN
INTRAVENOUS | Status: AC
  Filled 2023-04-07: qty 20

## 2023-04-07 MED ORDER — FENTANYL CITRATE (PF) 100 MCG/2ML IJ SOLN
INTRAMUSCULAR | Status: AC
  Filled 2023-04-07: qty 2

## 2023-04-07 MED ORDER — EPHEDRINE SULFATE-NACL 50-0.9 MG/10ML-% IV SOSY
PREFILLED_SYRINGE | INTRAVENOUS | Status: DC | PRN
  Administered 2023-04-07: 5 mg via INTRAVENOUS
  Administered 2023-04-07 (×2): 10 mg via INTRAVENOUS
  Administered 2023-04-07: 5 mg via INTRAVENOUS

## 2023-04-07 MED ORDER — CEFAZOLIN SODIUM-DEXTROSE 2-4 GM/100ML-% IV SOLN
INTRAVENOUS | Status: AC
  Filled 2023-04-07: qty 100

## 2023-04-07 MED ORDER — FENTANYL CITRATE (PF) 100 MCG/2ML IJ SOLN
INTRAMUSCULAR | Status: AC
Start: 2023-04-07 — End: ?
  Filled 2023-04-07: qty 2

## 2023-04-07 MED ORDER — VITAMIN D (ERGOCALCIFEROL) 1.25 MG (50000 UNIT) PO CAPS
50000.0000 [IU] | ORAL_CAPSULE | ORAL | Status: DC
  Administered 2023-04-07: 50000 [IU] via ORAL
  Filled 2023-04-07: qty 1

## 2023-04-07 MED ORDER — 0.9 % SODIUM CHLORIDE (POUR BTL) OPTIME
TOPICAL | Status: DC | PRN
  Administered 2023-04-07: 500 mL

## 2023-04-07 MED ORDER — ROCURONIUM BROMIDE 10 MG/ML (PF) SYRINGE
PREFILLED_SYRINGE | INTRAVENOUS | Status: AC
  Filled 2023-04-07: qty 10

## 2023-04-07 MED ORDER — OXYCODONE HCL 5 MG/5ML PO SOLN: 5.0000 mg | Freq: Once | ORAL | Status: DC | PRN

## 2023-04-07 MED ORDER — SUGAMMADEX SODIUM 200 MG/2ML IV SOLN
INTRAVENOUS | Status: DC | PRN
Start: 2023-04-07 — End: 2023-04-07
  Administered 2023-04-07: 200 mg via INTRAVENOUS

## 2023-04-07 MED ORDER — PROPOFOL 10 MG/ML IV BOLUS
INTRAVENOUS | Status: DC | PRN
  Administered 2023-04-07: 120 mg via INTRAVENOUS
  Administered 2023-04-07: 50 mg via INTRAVENOUS

## 2023-04-07 MED ORDER — LACTATED RINGERS IV SOLN: INTRAVENOUS | Status: DC | PRN

## 2023-04-07 MED ORDER — LIDOCAINE HCL (PF) 2 % IJ SOLN
INTRAMUSCULAR | Status: AC
  Filled 2023-04-07: qty 5

## 2023-04-07 MED ORDER — PHENYLEPHRINE 80 MCG/ML (10ML) SYRINGE FOR IV PUSH (FOR BLOOD PRESSURE SUPPORT)
PREFILLED_SYRINGE | INTRAVENOUS | Status: DC | PRN
  Administered 2023-04-07: 160 ug via INTRAVENOUS
  Administered 2023-04-07: 80 ug via INTRAVENOUS
  Administered 2023-04-07 (×2): 160 ug via INTRAVENOUS
  Administered 2023-04-07: 80 ug via INTRAVENOUS
  Administered 2023-04-07 (×3): 160 ug via INTRAVENOUS

## 2023-04-07 MED ORDER — PHENYLEPHRINE 80 MCG/ML (10ML) SYRINGE FOR IV PUSH (FOR BLOOD PRESSURE SUPPORT)
PREFILLED_SYRINGE | INTRAVENOUS | Status: AC
  Filled 2023-04-07: qty 10

## 2023-04-07 MED ORDER — SUGAMMADEX SODIUM 200 MG/2ML IV SOLN
INTRAVENOUS | Status: AC
  Filled 2023-04-07: qty 2

## 2023-04-07 MED ORDER — ACETAMINOPHEN 10 MG/ML IV SOLN
INTRAVENOUS | Status: DC | PRN
  Administered 2023-04-07: 1000 mg via INTRAVENOUS

## 2023-04-07 SURGICAL SUPPLY — 48 items
ANCHOR JUGGERKNOT W/DRL 2/1.45 (Anchor) IMPLANT
ANCHOR SUT 1.45 SZ 1 SHORT (Anchor) IMPLANT
BIT DRILL 2.0X130 SLD AO (BIT) IMPLANT
BNDG COHESIVE 4X5 TAN STRL LF (GAUZE/BANDAGES/DRESSINGS) ×1 IMPLANT
BNDG ELASTIC 4X5.8 VLCR NS LF (GAUZE/BANDAGES/DRESSINGS) ×1 IMPLANT
BNDG ESMARCH 4X12 STRL LF (GAUZE/BANDAGES/DRESSINGS) ×1 IMPLANT
BNDG GAUZE DERMACEA FLUFF 4 (GAUZE/BANDAGES/DRESSINGS) ×1 IMPLANT
CUFF TOURN SGL QUICK 30 NS (TOURNIQUET CUFF) IMPLANT
DRAPE C-ARM XRAY 36X54 (DRAPES) ×1 IMPLANT
DRAPE C-ARMOR (DRAPES) ×1 IMPLANT
DURAPREP 26ML APPLICATOR (WOUND CARE) ×1 IMPLANT
ELECT REM PT RETURN 9FT ADLT (ELECTROSURGICAL) ×1 IMPLANT
ELECTRODE REM PT RTRN 9FT ADLT (ELECTROSURGICAL) ×1 IMPLANT
GAUZE SPONGE 4X4 12PLY STRL (GAUZE/BANDAGES/DRESSINGS) ×1 IMPLANT
GAUZE STRETCH 2X75IN STRL (MISCELLANEOUS) ×1 IMPLANT
GAUZE XEROFORM 1X8 LF (GAUZE/BANDAGES/DRESSINGS) ×1 IMPLANT
GLOVE BIO SURGEON STRL SZ7 (GLOVE) ×1 IMPLANT
GLOVE INDICATOR 7.0 STRL GRN (GLOVE) ×1 IMPLANT
GOWN STRL REUS W/ TWL LRG LVL3 (GOWN DISPOSABLE) ×3 IMPLANT
KIT TURNOVER KIT A (KITS) ×1 IMPLANT
MANIFOLD NEPTUNE II (INSTRUMENTS) ×1 IMPLANT
NDL HYPO 22X1.5 SAFETY MO (MISCELLANEOUS) ×1 IMPLANT
NEEDLE HYPO 22X1.5 SAFETY MO (MISCELLANEOUS) ×1 IMPLANT
NS IRRIG 500ML POUR BTL (IV SOLUTION) ×1 IMPLANT
PACK EXTREMITY ARMC (MISCELLANEOUS) ×1 IMPLANT
PAD ABD DERMACEA PRESS 5X9 (GAUZE/BANDAGES/DRESSINGS) IMPLANT
PAD PREP OB/GYN DISP 24X41 (PERSONAL CARE ITEMS) ×1 IMPLANT
PADDING CAST COTTON 6X4 STRL (CAST SUPPLIES) IMPLANT
PLATE FIBULA 9HOLE ANATOMICAL (Plate) IMPLANT
SCREW LOCK PLATE R3 2.7X10 (Screw) IMPLANT
SCREW LOCK PLATE R3 2.7X11 (Screw) IMPLANT
SCREW LOCK PLATE R3 2.7X12 (Screw) IMPLANT
SCREW LOCK PLT 9X2.7X R3CON (Screw) IMPLANT
SPLINT CAST 1 STEP 4X30 (MISCELLANEOUS) ×1 IMPLANT
SPONGE T-LAP 18X18 ~~LOC~~+RFID (SPONGE) ×1 IMPLANT
STAPLER SKIN PROX 35W (STAPLE) ×1 IMPLANT
STOCKINETTE M/LG 89821 (MISCELLANEOUS) ×1 IMPLANT
STRAP SAFETY 5IN WIDE (MISCELLANEOUS) ×1 IMPLANT
STRIP CLOSURE SKIN 1/2X4 (GAUZE/BANDAGES/DRESSINGS) IMPLANT
SUT ETHILON 3-0 FS-10 30 BLK (SUTURE) ×1 IMPLANT
SUT VIC AB 2-0 CT1 TAPERPNT 27 (SUTURE) ×1 IMPLANT
SUT VIC AB 3-0 SH 27X BRD (SUTURE) ×1 IMPLANT
SUTURE EHLN 3-0 FS-10 30 BLK (SUTURE) IMPLANT
SYR 10ML LL (SYRINGE) ×1 IMPLANT
SYR 50ML LL SCALE MARK (SYRINGE) ×1 IMPLANT
TRAP FLUID SMOKE EVACUATOR (MISCELLANEOUS) ×1 IMPLANT
WATER STERILE IRR 500ML POUR (IV SOLUTION) ×1 IMPLANT
WIRE OLIVE SMOOTH 1.4MMX60MM (WIRE) IMPLANT

## 2023-04-07 NOTE — Anesthesia Procedure Notes (Signed)
 Procedure Name: Intubation Date/Time: 04/07/2023 10:10 AM  Performed by: Hezzie Bump, CRNAPre-anesthesia Checklist: Patient identified, Patient being monitored, Timeout performed, Emergency Drugs available and Suction available Patient Re-evaluated:Patient Re-evaluated prior to induction Oxygen Delivery Method: Circle system utilized Preoxygenation: Pre-oxygenation with 100% oxygen Induction Type: IV induction Ventilation: Mask ventilation without difficulty Laryngoscope Size: McGrath and 4 Grade View: Grade II Tube type: Oral Tube size: 7.5 mm Number of attempts: 1 Airway Equipment and Method: Stylet Placement Confirmation: ETT inserted through vocal cords under direct vision, positive ETCO2 and breath sounds checked- equal and bilateral Secured at: 22 cm Tube secured with: Tape Dental Injury: Teeth and Oropharynx as per pre-operative assessment

## 2023-04-07 NOTE — Op Note (Signed)
 PODIATRY / FOOT AND ANKLE SURGERY OPERATIVE REPORT    SURGEON: Rosetta Posner, DPM  PRE-OPERATIVE DIAGNOSIS:  1.  Right ankle bimalleolar fracture closed, displaced  POST-OPERATIVE DIAGNOSIS:  1.  Right ankle bimalleolar fracture closed, displaced 2.  Right ankle deltoid ligament rupture  PROCEDURE(S): Right bimalleolar ankle fracture open reduction with internal fixation Repair of right ankle deltoid ligament  HEMOSTASIS: Right thigh tourniquet  ANESTHESIA: general  ESTIMATED BLOOD LOSS: 20 cc  FINDING(S): 1.  Large oblique posteriorly and laterally displaced fibular fracture 2.  Small avulsion fracture off the medial malleolus with complete tear of the deltoid ligament  PATHOLOGY/SPECIMEN(S): None  INDICATIONS:   Chad Vazquez is a 68 y.o. male who presents with a right closed displaced bimalleolar ankle fracture after sustaining a fall.  Patient arrived to the hospital via EMS and had x-ray imaging taken which showed the displaced fracture.  All treatment options were discussed with the patient and patient's family both conservative and surgical attempts at correction include potential risks and complications at this time patient and family have elected for surgical invention today consisting of right bimalleolar ankle fracture open reduction with internal fixation with possible repair of syndesmotic and deltoid ligaments.  No guarantees given.  Consent obtained prior to procedure with brother [name].  DESCRIPTION: After obtaining full informed written consent, the patient was brought back to the operating room and placed supine upon the operating table.  The patient received IV antibiotics prior to induction.  After obtaining adequate anesthesia, the patient was prepped and draped in the standard fashion.  20 cc of half percent Marcaine plain was injected about the right ankle and an ankle block proximal to the fracture area.  An Esmarch bandage was used to exsanguinate the right  lower extremity the pneumatic thigh tourniquet was inflated.  Attention was directed to the right lateral ankle where an incision was drawn out over the distal fibula from the tip of the lateral malleolus to the distal shaft.  This was marked under fluoroscopic guidance.  Preoperative C-arm imaging was used to verify position of incision which appeared to be excellent.  Appeared to show a bimalleolar ankle fracture closed, displaced with only small avulsion fracture of the medial malleolus.  An incision was made over the lateral malleolus as described above.  The incision was deepened through the subcutaneous tissues utilizing sharp and blunt dissection and care was taken to identify and retract all vital neurovascular structures and all venous contributories were cauterized as necessary.  The superficial peroneal nerve was identified and retracted anteriorly throughout the remainder the case and protected.  At this time a periosteal incision was made into the distal fibula and lateral malleolus.  The fracture was easily able to be visualized and appeared to have a hematoma present within the fracture.  The fracture appeared to be posteriorly and proximally displaced as well as laterally displaced.  It appeared to be a long oblique fracture with minimal to no combination.  The periosteum was dissected free from the fracture area as well as the hematoma type tissue was removed with a curette and rongeur.  After adequate visualization was obtained a flush was performed with copious amounts normal sterile saline within the ankle joint as well as the lateral malleolar fracture area.  At this time reduction was then performed of the lateral fibular fracture.  It was difficult to reduce the fracture as it appeared to be fairly unstable.  The fracture was held in place with reduction clamps.  Finally successful relocation was noted of the distal fibula pulling the fibula out to length and rotating it into the appropriate  position.  C-arm imaging was utilized to verify correct position which appeared to be excellent.  The ankle appeared to be out to length at the fibula and the ankle mortise appear to be well aligned overall.  At this time a 9 hole Paragon 28 lateral distal fibular locking plate was applied and held into place with temporary fixation all of wires.  Due to the multiple clamps on and placed to hold the ankle in a stable position no lag screw was placed as stability was needed for plate fixation.  The fracture was well compressed overall with the multiple reduction clamps that were used.  C-arm imaging was utilized to verify position of the plate and reduction of the fracture which appeared to be excellent overall and the ankle appeared to be near anatomic.  At this time locking screws then placed distal to the fracture site and proximal to the fracture site.  3 locking screws were placed proximally and 4 locking screws were placed distally, they were all 2.7 mm in length.  The 2 holes that were left open are at the area of the fracture site.  The screws were placed utilizing standard AO principles and techniques.  A cotton hook test was then performed under fluoroscopic guidance, no gapping appeared to be noted at the syndesmosis indicating no syndesmotic injury.  Valgus stress testing was then performed of the ankle showing gapping of the deltoid ligament indicative of rupture of the deltoid ligament.  Due to the rupture of the deltoid ligament another incision was made over the medial malleolus slightly posterior to the great saphenous vein.  The incision was deepened through the subcutaneous tissues utilizing sharp blunt dissection and care was taken to identify and retract all vital neurovascular structures and all venous contributories were cauterized as necessary.  The periosteum did not have to be dissected as the deltoid ligament appeared to be completely ruptured off of the medial malleolus.  There appeared  to be a small fracture fragment within the deltoid ligament complex.  The small fracture fragment was resected and passed off the operative site as it appeared to be very minimal.  The surgical site and ankle joint was flushed with copious amounts normal sterile saline.  There did not appear to be any cartilaginous damage to the ankle joint on inspection medially.  At this time a Biomet juggernaut anchor 1.4 mm all suture was placed through the medial malleolus off of the area of the lifted deltoid ligament complex.  This appeared to sit excellently.  The suture from the anchor was then placed through the distal deltoid ligament with a few passes.  The distal deltoid ligament both deep and superficial were then tied back down onto the medial malleolus using the suture anchor.  This appeared to stabilize the deltoid ligament very well.  Reinforcement was then performed of the repair with 2-0 Vicryl and a pants over vest type stitching.  Final C-arm imaging was then taken showing anatomic position of the ankle joint with the fibula out to length and rotated in the frontal plane the appropriate position.  The fibular plate appeared to be intact with the appropriate size screws and plate.  Valgus stress testing was then performed once again to the ankle joint and the ankle appeared to be very stable with no further displacement indicative of successful repair of the deltoid ligament complex.  The surgical sites were flushed with copious amounts normal sterile saline.  The capsular and periosteal tissue at both incision sites was reapproximated well coapted with 3-0 Vicryl.  The subcutaneous tissues reapproximated well coapted with 3-0 Vicryl.  The skin was then reapproximated well coapted with a combination of 3-0 nylon and skin staples.  The pneumatic thigh tourniquet was deflated and prompt hyperemic response was noted all digits of the right foot.  An additional 20 cc of Exparel was injected about the incisional  areas for postoperative block.  A postoperative dressing was then applied consisting of Xeroform to the incisional areas followed by 4 x 4 gauze, ABD pad, Kerlix, Webril, posterior splint, Ace wrap.  The patient tolerated the procedure and anesthesia well and was transferred to recovery room vital signs stable vascular status intact all toes of the right foot.  Following a period of postoperative monitoring the patient be discharged back to the inpatient room with the appropriate orders, instructions, and medications.  Tried to call caretaker, brother, [name], unable to reach, left message.  Will plan to see patient tomorrow for follow-up and likely sign off.  Patient will need placement in rehab.  Will have to be nonweightbearing for 6 weeks.  COMPLICATIONS: none  CONDITION: good, stable  Rosetta Posner, DPM

## 2023-04-07 NOTE — H&P (Signed)
 HISTORY AND PHYSICAL INTERVAL NOTE:  04/07/2023  9:23 AM  Chad Vazquez  has presented today for surgery, with the diagnosis of bimalleolar fracture right displaced.  The various methods of treatment have been discussed with the patient.  No guarantees were given.  After consideration of risks, benefits and other options for treatment, the patient/family have consented to surgery.  I have reviewed the patients' chart and labs.    PROCEDURE: RIGHT BIMALLEOLAR ANKLE FRACTURE ORIF POSSIBLE SYNDESMOTIC LIGAMENT REPAIR RIGHT POSSIBLE DELTOID LIGAMENT REPAIR RIGHT   A history and physical examination was performed in the hospital  The patient was reexamined.  There have been no changes to this history and physical examination.  Rosetta Posner, DPM

## 2023-04-07 NOTE — Progress Notes (Signed)
 [facility] Progress Note  Patient: Chad Vazquez    NFA:[id number]  DOA: 04/05/2023     Date of Service: the patient was seen and examined on 04/07/2023  Chief Complaint  Patient presents with   Fall   Brief hospital course: Chad Vazquez is a 68 y.o. male with medical history significant of hypertension, intellectual disability, hypothyroidism presenting with mechanical fall, right ankle fracture.  Patient with noted baseline intellectual disability.  Also with intermittent gait instability.  Has to hold onto furniture walk around house.  Per report, patient with fall, shallow.  No reported weakness or dizziness prior to event.  Fall was not witnessed.  At present, patient denies any headache.  No chest pain or shortness of breath.  Has significant right ankle pain after fall. Presented to the ER afebrile, hemodynamically stable.  Satting well on room air.  White count 11.7, hemoglobin 15.2, platelets 196, creatinine 1.9.  Glucose 157.  CT head and CT C-spine grossly stable.  Right ankle plain films with displaced distal fibular and medial malleolus fracture.   Assessment and Plan:  # Right ankle bimalleolar fracture closed, displaced # Right ankle deltoid ligament rupture S/p Mechanical Fall  Xray: R fibular fracture s/p mechanical fall at home (fell getting out of shower)  Orthopedic surgery consulted, s/p Right bimalleolar ankle fracture open reduction with internal fixation and Repair of right ankle deltoid ligament done on 3/2 Continue prn meds for pain control Continue fall precautions PT and OT evaluation Follow orthopedic surgery for further recommendation      CKD (chronic kidney disease) Cr 1.9 w/ GFR 30s on presentation  Suspect this may be near baseline in review of prior renal function roughly 5 years ago  Will monitor renal function  Minimize nephrotoxic agents  Follow   Hypothyroidism, unspecified Cont synthroid      HTN (hypertension) BP stable   Titrate home regimen    Vitamin D level 36, started vitamin D 50,000 units p.o. weekly to prevent vitamin D deficiency Vitamin B12 level 246, goal >400, started vitamin B12 1000 mcg IM injection during hospital stay followed by oral supplement. Repeat vitamin D and B12 level after 3 to 6 months.   Body mass index is 27.26 kg/m.  Interventions:  Diet: Regular diet DVT Prophylaxis: Subcutaneous Lovenox   Advance goals of care discussion: Full code  Family Communication: family was not present at bedside, at the time of interview.  Patient is AAO x 1   Disposition:  Pt is from Home, admitted with Fall and right ankle fracture, s/p repair done on 3/2, Needs PT and OT eval for possible SNF placement.  Medically stable to discharge to SNF when bed will be available.   Subjective: No significant events overnight, patient was seen after right ankle surgery, resting comfortably, denied any pain.  Patient is AO x 1 able to tell me his first name only.  Did not offer any complaints.  Physical Exam: General: NAD, lying comfortably Appear in no distress, affect appropriate Eyes: PERRLA ENT: Oral Mucosa Clear, moist  Neck: no JVD,  Cardiovascular: S1 and S2 Present, no Murmur,  Respiratory: good respiratory effort, Bilateral Air entry equal and Decreased, no Crackles, no wheezes Abdomen: Bowel Sound present, Soft and no tenderness,  Skin: no rashes Extremities: Right ankle postop dressing intact, LLE No pedal edema, no calf tenderness Neurologic: without any new focal findings Gait not checked due to patient safety concerns  Vitals:   04/07/23 1245 04/07/23 1300 04/07/23 1315  04/07/23 1350  BP: 112/80 124/82 (!) 127/96 130/75  Pulse: 98 99  100  Resp: 19 14 16 16   Temp:   (!) 97 F (36.1 C) 97.6 F (36.4 C)  TempSrc:    Oral  SpO2: 93% 94% 95% 91%  Weight:      Height:        Intake/Output Summary (Last 24 hours) at 04/07/2023 1545 Last data filed at 04/07/2023 1156 Gross  per 24 hour  Intake 1840 ml  Output 505 ml  Net 1335 ml   Filed Weights   04/05/23 0325  Weight: 86.2 kg    Data Reviewed: I have personally reviewed and interpreted daily labs, tele strips, imagings as discussed above. I reviewed all nursing notes, pharmacy notes, vitals, pertinent old records I have discussed plan of care as described above with RN and patient/family.  CBC: Recent Labs  Lab 04/05/23 0330 04/06/23 0702 04/07/23 0530  WBC 11.7* 8.8 8.1  NEUTROABS 9.4*  --   --   HGB 15.2 14.1 13.5  HCT 46.8 43.1 41.5  MCV 87.0 87.4 87.4  PLT 196 181 165   Basic Metabolic Panel: Recent Labs  Lab 04/05/23 0330 04/06/23 0702 04/07/23 0530  NA 139 141 140  K 3.9 4.2 3.6  CL 104 108 106  CO2 24 27 28   GLUCOSE 157* 133* 120*  BUN 32* 38* 41*  CREATININE 1.87* 1.90* 1.78*  CALCIUM 9.0 8.7* 8.4*  MG  --  2.3 2.3  PHOS  --  3.6 3.2    Studies: DG C-Arm 1-60 Min-No Report Result Date: 04/07/2023 Fluoroscopy was utilized by the requesting physician.  No radiographic interpretation.   DG C-Arm 1-60 Min-No Report Result Date: 04/07/2023 Fluoroscopy was utilized by the requesting physician.  No radiographic interpretation.    Scheduled Meds:  cyanocobalamin  1,000 mcg Intramuscular Daily   Followed by   Melene Muller ON 04/13/2023] vitamin B-12  1,000 mcg Oral Daily   enoxaparin (LOVENOX) injection  40 mg Subcutaneous Q24H   levothyroxine  75 mcg Oral Q0600    morphine injection  4 mg Intravenous Once   Vitamin D (Ergocalciferol)  50,000 Units Oral Q7 days   Continuous Infusions: PRN Meds: acetaminophen **OR** acetaminophen, albuterol, morphine injection, ondansetron **OR** ondansetron (ZOFRAN) IV, oxyCODONE  Time spent: 35 minutes  Author: Gillis Santa. MD [facility] 04/07/2023 3:45 PM  To reach On-call, see care teams to locate the attending and reach out to them via [email or web address]. If 7PM-7AM, please contact night-coverage If you still have difficulty reaching  the attending provider, please page the [facility] (Director on Call) for [facility] on amion for assistance.

## 2023-04-07 NOTE — Anesthesia Postprocedure Evaluation (Signed)
 Anesthesia Post Note  Patient: Chad Vazquez  Procedure(s) Performed: OPEN REDUCTION INTERNAL FIXATION (ORIF) ANKLE FRACTURE (Right: Ankle)  Patient location during evaluation: PACU Anesthesia Type: General Level of consciousness: awake and alert Pain management: pain level controlled Vital Signs Assessment: post-procedure vital signs reviewed and stable Respiratory status: spontaneous breathing, nonlabored ventilation, respiratory function stable and patient connected to nasal cannula oxygen Cardiovascular status: blood pressure returned to baseline and stable Postop Assessment: no apparent nausea or vomiting Anesthetic complications: no   No notable events documented.   Last Vitals:  Vitals:   04/07/23 1230 04/07/23 1245  BP: 103/64 112/80  Pulse: 99 98  Resp: 15 19  Temp:    SpO2: 96% 93%    Last Pain:  Vitals:   04/07/23 1230  TempSrc:   PainSc: Asleep                 Cleda Mccreedy Alizea Pell

## 2023-04-07 NOTE — Transfer of Care (Signed)
 Immediate Anesthesia Transfer of Care Note  Patient: Chad Vazquez  Procedure(s) Performed: OPEN REDUCTION INTERNAL FIXATION (ORIF) ANKLE FRACTURE (Right: Ankle)  Patient Location: PACU  Anesthesia Type:General  Level of Consciousness: drowsy  Airway & Oxygen Therapy: Patient Spontanous Breathing and Patient connected to face mask oxygen  Post-op Assessment: Report given to RN and Post -op Vital signs reviewed and stable  Post vital signs: Reviewed and stable  Last Vitals:  Vitals Value Taken Time  BP    Temp    Pulse 89 04/07/23 1212  Resp    SpO2 93 % 04/07/23 1212  Vitals shown include unfiled device data.  Last Pain:  Vitals:   04/07/23 0824  TempSrc: Oral  PainSc:          Complications: No notable events documented.

## 2023-04-07 NOTE — Plan of Care (Signed)
  Problem: Health Behavior/Discharge Planning: Goal: Ability to manage health-related needs will improve Outcome: Progressing   Problem: Clinical Measurements: Goal: Ability to maintain clinical measurements within normal limits will improve Outcome: Progressing Goal: Will remain free from infection Outcome: Progressing Goal: Diagnostic test results will improve Outcome: Progressing Goal: Respiratory complications will improve Outcome: Progressing Goal: Cardiovascular complication will be avoided Outcome: Progressing   Problem: Nutrition: Goal: Adequate nutrition will be maintained Outcome: Progressing   Problem: Coping: Goal: Level of anxiety will decrease Outcome: Progressing   Problem: Elimination: Goal: Will not experience complications related to bowel motility Outcome: Progressing Goal: Will not experience complications related to urinary retention Outcome: Progressing   Problem: Pain Managment: Goal: General experience of comfort will improve and/or be controlled Outcome: Progressing   Problem: Safety: Goal: Ability to remain free from injury will improve Outcome: Progressing   Problem: Skin Integrity: Goal: Risk for impaired skin integrity will decrease Outcome: Progressing

## 2023-04-08 DIAGNOSIS — S82841A Displaced bimalleolar fracture of right lower leg, initial encounter for closed fracture: Secondary | ICD-10-CM | POA: Diagnosis not present

## 2023-04-08 DIAGNOSIS — S82891A Other fracture of right lower leg, initial encounter for closed fracture: Secondary | ICD-10-CM | POA: Diagnosis not present

## 2023-04-08 LAB — CBC
HCT: 39.6 % (ref 39.0–52.0)
Hemoglobin: 13.2 g/dL (ref 13.0–17.0)
MCH: 29.6 pg (ref 26.0–34.0)
MCHC: 33.3 g/dL (ref 30.0–36.0)
MCV: 88.8 fL (ref 80.0–100.0)
Platelets: 193 10*3/uL (ref 150–400)
RBC: 4.46 MIL/uL (ref 4.22–5.81)
RDW: 15.4 % (ref 11.5–15.5)
WBC: 11.2 10*3/uL — ABNORMAL HIGH (ref 4.0–10.5)
nRBC: 0 % (ref 0.0–0.2)

## 2023-04-08 LAB — BASIC METABOLIC PANEL
Anion gap: 9 (ref 5–15)
BUN: 44 mg/dL — ABNORMAL HIGH (ref 8–23)
CO2: 25 mmol/L (ref 22–32)
Calcium: 7.9 mg/dL — ABNORMAL LOW (ref 8.9–10.3)
Chloride: 103 mmol/L (ref 98–111)
Creatinine, Ser: 1.87 mg/dL — ABNORMAL HIGH (ref 0.61–1.24)
GFR, Estimated: 39 mL/min — ABNORMAL LOW (ref >60)
Glucose, Bld: 232 mg/dL — ABNORMAL HIGH (ref 70–99)
Potassium: 4 mmol/L (ref 3.5–5.1)
Sodium: 137 mmol/L (ref 135–145)

## 2023-04-08 MED ORDER — BISACODYL 5 MG PO TBEC
10.0000 mg | DELAYED_RELEASE_TABLET | Freq: Once | ORAL | Status: AC
  Administered 2023-04-08: 10 mg via ORAL
  Filled 2023-04-08: qty 2

## 2023-04-08 MED ORDER — BISACODYL 10 MG RE SUPP
10.0000 mg | Freq: Every day | RECTAL | Status: DC | PRN
  Filled 2023-04-08: qty 1

## 2023-04-08 MED ORDER — BISACODYL 5 MG PO TBEC
10.0000 mg | DELAYED_RELEASE_TABLET | Freq: Every day | ORAL | Status: DC
  Administered 2023-04-08 – 2023-04-11 (×4): 10 mg via ORAL
  Filled 2023-04-08 (×4): qty 2

## 2023-04-08 MED ORDER — POLYETHYLENE GLYCOL 3350 17 G PO PACK
17.0000 g | PACK | Freq: Two times a day (BID) | ORAL | Status: DC
  Administered 2023-04-08 – 2023-04-12 (×8): 17 g via ORAL
  Filled 2023-04-08 (×9): qty 1

## 2023-04-08 NOTE — Progress Notes (Signed)
 PODIATRY / FOOT AND ANKLE SURGERY PROGRESS NOTE  Requesting Physician: Dr. Alvester Morin  Reason for consult: R ankle fracture  HPI: Chad Vazquez is a 68 y.o. male who appears since today resting in bed comfortably status post 1 day right fibular fracture ORIF with deltoid ligament repair.  Patient is kept dressings clean, dry, and intact since the procedure.  He has remained nonweightbearing to the right lower extremity since the procedure.  Due to his intellectual disability it is difficult to assess the pain that he is having at this time but clinically appears to be well-controlled.  PMHx:  Past Medical History:  Diagnosis Date   Hearing difficulty of both ears    wears hearing aids   Hypertension     Surgical Hx: History reviewed. No pertinent surgical history.  FHx: History reviewed. No pertinent family history.  Social History:  reports that he has never smoked. He has never used smokeless tobacco. He reports that he does not drink alcohol and does not use drugs.  Allergies: No Known Allergies  Medications Prior to Admission  Medication Sig Dispense Refill   atorvastatin (LIPITOR) 40 MG tablet Take 40 mg by mouth at bedtime.     FARXIGA 10 MG TABS tablet Take 10 mg by mouth daily.     losartan (COZAAR) 100 MG tablet Take 100 mg by mouth daily. for high blood pressure  3   metoprolol succinate (TOPROL-XL) 100 MG 24 hr tablet Take 100 mg by mouth daily. for high blood pressure  1   albuterol (PROVENTIL HFA;VENTOLIN HFA) 108 (90 Base) MCG/ACT inhaler Inhale 2 puffs into the lungs every 6 (six) hours as needed for wheezing or shortness of breath. 1 Inhaler 2   atorvastatin (LIPITOR) 20 MG tablet TAKE 1 TABLET BY MOUTH AT BEDTIME FOR HIGH CHOLESTEROL (Patient not taking: Reported on 04/05/2023)  3   doxycycline (VIBRA-TABS) 100 MG tablet Take 100 mg by mouth. (Patient not taking: Reported on 04/05/2023)     fluticasone (FLONASE) 50 MCG/ACT nasal spray Place 2 sprays into both nostrils  daily. 16 g 0   hydrocortisone (ANUSOL-HC) 2.5 % rectal cream Place 1 application rectally 2 (two) times daily. 30 g 0   levothyroxine (SYNTHROID, LEVOTHROID) 75 MCG tablet Take by mouth.     predniSONE (STERAPRED UNI-PAK 21 TAB) 10 MG (21) TBPK tablet Take by mouth daily. Dispense steroid taper pack as directed (Patient not taking: Reported on 03/11/2018) 21 tablet 0   sodium chloride (OCEAN) 0.65 % SOLN nasal spray Place 2 sprays into both nostrils every 2 (two) hours while awake.  0    Physical Exam: General: Alert and oriented.  No apparent distress.  Splint appears to be intact to the right lower extremity today with no strikethrough or drainage.  Patient able to dorsiflex and plantarflex digits.  Patient does feel light touch sensation to digits of the right foot.  Capillary fill time intact to digits.  Results for orders placed or performed during the hospital encounter of 04/05/23 (from the past 48 hours)  Basic metabolic panel     Status: Abnormal   Collection Time: 04/07/23  5:30 AM  Result Value Ref Range   Sodium 140 135 - 145 mmol/L   Potassium 3.6 3.5 - 5.1 mmol/L   Chloride 106 98 - 111 mmol/L   CO2 28 22 - 32 mmol/L   Glucose, Bld 120 (H) 70 - 99 mg/dL    Comment: Glucose reference range applies only to samples taken after fasting  for at least 8 hours.   BUN 41 (H) 8 - 23 mg/dL   Creatinine, Ser 1.61 (H) 0.61 - 1.24 mg/dL   Calcium 8.4 (L) 8.9 - 10.3 mg/dL   GFR, Estimated 41 (L) >60 mL/min    Comment: (NOTE) Calculated using the CKD-EPI Creatinine Equation (2021)    Anion gap 6 5 - 15    Comment: Performed at [facility], [address] [place], [place] [address]  CBC     Status: Abnormal   Collection Time: 04/07/23  5:30 AM  Result Value Ref Range   WBC 8.1 4.0 - 10.5 K/uL   RBC 4.75 4.22 - 5.81 MIL/uL   Hemoglobin 13.5 13.0 - 17.0 g/dL   HCT 54.0 98.1 - 19.1 %   MCV 87.4 80.0 - 100.0 fL   MCH 28.4 26.0 - 34.0 pg   MCHC 32.5 30.0 - 36.0 g/dL   RDW  47.8 (H) 29.5 - 15.5 %   Platelets 165 150 - 400 K/uL   nRBC 0.0 0.0 - 0.2 %    Comment: Performed at [facility], [address] [place], [place] [address]  Phosphorus     Status: None   Collection Time: 04/07/23  5:30 AM  Result Value Ref Range   Phosphorus 3.2 2.5 - 4.6 mg/dL    Comment: Performed at [facility], [address] [place], [place] [address]  Magnesium     Status: None   Collection Time: 04/07/23  5:30 AM  Result Value Ref Range   Magnesium 2.3 1.7 - 2.4 mg/dL    Comment: Performed at [facility], [address] [place], [place] [address]  Basic metabolic panel     Status: Abnormal   Collection Time: 04/08/23  2:10 AM  Result Value Ref Range   Sodium 137 135 - 145 mmol/L   Potassium 4.0 3.5 - 5.1 mmol/L   Chloride 103 98 - 111 mmol/L   CO2 25 22 - 32 mmol/L   Glucose, Bld 232 (H) 70 - 99 mg/dL    Comment: Glucose reference range applies only to samples taken after fasting for at least 8 hours.   BUN 44 (H) 8 - 23 mg/dL   Creatinine, Ser 9.52 (H) 0.61 - 1.24 mg/dL   Calcium 7.9 (L) 8.9 - 10.3 mg/dL   GFR, Estimated 39 (L) >60 mL/min    Comment: (NOTE) Calculated using the CKD-EPI Creatinine Equation (2021)    Anion gap 9 5 - 15    Comment: Performed at [facility], [address] [place], [place] [address]  CBC     Status: Abnormal   Collection Time: 04/08/23  2:10 AM  Result Value Ref Range   WBC 11.2 (H) 4.0 - 10.5 K/uL   RBC 4.46 4.22 - 5.81 MIL/uL   Hemoglobin 13.2 13.0 - 17.0 g/dL   HCT 44.0 10.2 - 72.5 %   MCV 88.8 80.0 - 100.0 fL   MCH 29.6 26.0 - 34.0 pg   MCHC 33.3 30.0 - 36.0 g/dL   RDW 36.6 44.0 - 34.7 %   Platelets 193 150 - 400 K/uL   nRBC 0.0 0.0 - 0.2 %    Comment: Performed at [facility], [address], [place], [place] [address]   DG Ankle 2 Views Right Result Date: 04/07/2023 CLINICAL DATA:  Intraoperative fluoroscopy 4 fibula fracture fixation. EXAM: RIGHT ANKLE - 2 VIEW  FLUOROSCOPY: Fluoroscopy Time:  42 seconds Radiation Exposure Index (if provided by  the fluoroscopic device): 1.41 mGy Number of Acquired Spot Images: 8 COMPARISON:  Ankle radiographs dated 04/05/2023. FINDINGS: Intraoperative fluoroscopy images demonstrate fixation with plate and screws for a distal fibula fracture. Osseous alignment is improved. The hardware appears intact. IMPRESSION: Intraoperative fluoroscopy for distal fibula fracture fixation. Electronically Signed   By: Romona Curls M.D.   On: 04/07/2023 16:07   DG C-Arm 1-60 Min-No Report Result Date: 04/07/2023 Fluoroscopy was utilized by the requesting physician.  No radiographic interpretation.   DG C-Arm 1-60 Min-No Report Result Date: 04/07/2023 Fluoroscopy was utilized by the requesting physician.  No radiographic interpretation.    Blood pressure 132/81, pulse 75, temperature (!) 97.4 F (36.3 C), temperature source Oral, resp. rate 18, height 5\' 10"  (1.778 m), weight 86.2 kg, SpO2 97%.   Assessment Right ankle bimalleolar fracture closed, displaced with deltoid rupture status post ORIF with deltoid ligament repair Intellectual disability Gait instability  Plan -Patient seen and examined -X-ray imaging reviewed and discussed. -Patient keep dressings clean, dry, and intact until postoperative visit.  Would recommend follow-up in 2 weeks for evaluation of incision and suture removal. -Patient is to maintain nonweightbearing at all times the right lower extremity.  Patient will likely have to stay this way for the next 6 weeks due to the severity of the ligamentous tear as well as fibular fracture. -Appreciate PT/OT recommendations.  Recommend skilled nursing facility rehab placement until patient is more ambulatory. -Appreciate medicine recommendations for DVT prophylaxis.  Upon discharge believe patient can take aspirin 81 mg twice daily.  Appreciate medicine recommendations also for pain management.  Likely could be discharged  on Norco 5/325 1 p.o. every 6 hours as needed for severe pain.  Patient may additionally use Tylenol or ibuprofen as needed mild to moderate pain.  Podiatry team to sign off at this time.  Discharge instructions placed in chart.  Patient should follow-up once again within 2 weeks of discharge date for further evaluation and likely suture removal.  Rosetta Posner, DPM 04/08/2023, 1:07 PM

## 2023-04-08 NOTE — Plan of Care (Signed)

## 2023-04-08 NOTE — Progress Notes (Signed)
 [facility] Progress Note  Patient: Chad Vazquez    VOZ:[id number]  DOA: 04/05/2023     Date of Service: the patient was seen and examined on 04/08/2023  Chief Complaint  Patient presents with   Fall   Brief hospital course: Chad Vazquez is a 68 y.o. male with medical history significant of hypertension, intellectual disability, hypothyroidism presenting with mechanical fall, right ankle fracture.  Patient with noted baseline intellectual disability.  Also with intermittent gait instability.  Has to hold onto furniture walk around house.  Per report, patient with fall, shallow.  No reported weakness or dizziness prior to event.  Fall was not witnessed.  At present, patient denies any headache.  No chest pain or shortness of breath.  Has significant right ankle pain after fall. Presented to the ER afebrile, hemodynamically stable.  Satting well on room air.  White count 11.7, hemoglobin 15.2, platelets 196, creatinine 1.9.  Glucose 157.  CT head and CT C-spine grossly stable.  Right ankle plain films with displaced distal fibular and medial malleolus fracture.   Assessment and Plan:  # Right ankle bimalleolar fracture closed, displaced # Right ankle deltoid ligament rupture S/p Mechanical Fall  Xray: R fibular fracture s/p mechanical fall at home (fell getting out of shower)  Orthopedic surgery consulted, s/p Right bimalleolar ankle fracture open reduction with internal fixation and Repair of right ankle deltoid ligament done on 3/2 Podiatry recommended to follow-up in 2 weeks for evaluation of incision and suture removal Continue none weightbearing right lower extremity for 6 weeks, Aspirin 81 mg p.o. twice daily and as needed meds for pain control.  Podiatry signed off. Continue prn meds for pain control Continue fall precautions PT and OT evaluation Follow TOC for placement     CKD (chronic kidney disease) Cr 1.9 w/ GFR 30s on presentation  Suspect this may be near baseline  in review of prior renal function roughly 5 years ago  Will monitor renal function  Minimize nephrotoxic agents  Follow   Hypothyroidism, unspecified Cont synthroid      HTN (hypertension) BP stable  Titrate home regimen    Vitamin D level 36, started vitamin D 50,000 units p.o. weekly to prevent vitamin D deficiency Vitamin B12 level 246, goal >400, started vitamin B12 1000 mcg IM injection during hospital stay followed by oral supplement. Repeat vitamin D and B12 level after 3 to 6 months.   Body mass index is 27.26 kg/m.  Interventions:  Diet: Regular diet DVT Prophylaxis: Subcutaneous Lovenox   Advance goals of care discussion: Full code  Family Communication: family was not present at bedside, at the time of interview.  Patient is AAO x 1   Disposition:  Pt is from Home, admitted with Fall and right ankle fracture, s/p repair done on 3/2, Needs PT and OT eval for possible SNF placement.  Medically stable to discharge to SNF when bed will be available. Follow TOC for disposition plan  Subjective: No significant events overnight, patient is AO x 1, knows only his first name.  Resting comfortably without any acute distress seems pain is under control.  Patient is able to offer any complaints. Patient knows that he is in the hospital, he was asking when he can go home. Patient with advised that he will need PT and OT evaluation and need to ambulate.  I doubt patient understood due to intellectual disability.  Physical Exam: General: NAD, lying comfortably Appear in no distress, affect appropriate Eyes: PERRLA ENT: Oral  Mucosa Clear, moist  Neck: no JVD,  Cardiovascular: S1 and S2 Present, no Murmur,  Respiratory: good respiratory effort, Bilateral Air entry equal and Decreased, no Crackles, no wheezes Abdomen: Bowel Sound present, Soft and no tenderness,  Skin: no rashes Extremities: Right ankle postop dressing intact, LLE No pedal edema, no calf  tenderness Neurologic: without any new focal findings Gait not checked due to patient safety concerns  Vitals:   04/07/23 1601 04/08/23 0031 04/08/23 0909 04/08/23 1610  BP: (!) 138/94 114/81 132/81 (!) 147/90  Pulse: 100 (!) 102 75 78  Resp: 16 17 18 16   Temp: 97.7 F (36.5 C) (!) 97.1 F (36.2 C) (!) 97.4 F (36.3 C) (!) 97.5 F (36.4 C)  TempSrc: Oral Oral Oral Oral  SpO2: 96% 93% 97% 94%  Weight:      Height:        Intake/Output Summary (Last 24 hours) at 04/08/2023 1625 Last data filed at 04/08/2023 1428 Gross per 24 hour  Intake 0 ml  Output 1900 ml  Net -1900 ml   Filed Weights   04/05/23 0325  Weight: 86.2 kg    Data Reviewed: I have personally reviewed and interpreted daily labs, tele strips, imagings as discussed above. I reviewed all nursing notes, pharmacy notes, vitals, pertinent old records I have discussed plan of care as described above with RN and patient/family.  CBC: Recent Labs  Lab 04/05/23 0330 04/06/23 0702 04/07/23 0530 04/08/23 0210  WBC 11.7* 8.8 8.1 11.2*  NEUTROABS 9.4*  --   --   --   HGB 15.2 14.1 13.5 13.2  HCT 46.8 43.1 41.5 39.6  MCV 87.0 87.4 87.4 88.8  PLT 196 181 165 193   Basic Metabolic Panel: Recent Labs  Lab 04/05/23 0330 04/06/23 0702 04/07/23 0530 04/08/23 0210  NA 139 141 140 137  K 3.9 4.2 3.6 4.0  CL 104 108 106 103  CO2 24 27 28 25   GLUCOSE 157* 133* 120* 232*  BUN 32* 38* 41* 44*  CREATININE 1.87* 1.90* 1.78* 1.87*  CALCIUM 9.0 8.7* 8.4* 7.9*  MG  --  2.3 2.3  --   PHOS  --  3.6 3.2  --     Studies: No results found.   Scheduled Meds:  bisacodyl  10 mg Oral QHS   cyanocobalamin  1,000 mcg Intramuscular Daily   Followed by   Melene Muller ON 04/13/2023] vitamin B-12  1,000 mcg Oral Daily   enoxaparin (LOVENOX) injection  40 mg Subcutaneous Q24H   levothyroxine  75 mcg Oral Q0600    morphine injection  4 mg Intravenous Once   polyethylene glycol  17 g Oral BID   Vitamin D (Ergocalciferol)  50,000 Units  Oral Q7 days   Continuous Infusions: PRN Meds: acetaminophen **OR** acetaminophen, albuterol, bisacodyl, morphine injection, ondansetron **OR** ondansetron (ZOFRAN) IV, oxyCODONE  Time spent: 35 minutes  Author: Gillis Santa. MD [facility] 04/08/2023 4:25 PM  To reach On-call, see care teams to locate the attending and reach out to them via [email or web address]. If 7PM-7AM, please contact night-coverage If you still have difficulty reaching the attending provider, please page the [facility] (Director on Call) for [facility] on amion for assistance.

## 2023-04-08 NOTE — Progress Notes (Signed)
 Physical Therapy Treatment Patient Details Name: Chad Vazquez MRN: [id number] DOB: September 11, 1955 Today's Date: 04/08/2023   History of Present Illness Pt is a 68 y.o. male with medical history significant of hypertension, intellectual disability, CKD, hypothyroidism who presents with right ankle pain after sustaining 2 falls.  Patient has a history of intellectual disability and gait instability. Pt diagnosed with bimalleolar right ankle fracture and is now s/p R ankle ORIF and ligament repair.    PT Comments  Of note pt now s/p R ankle ORIF but it was readily apparent during the session that there was no significant functional difference between pt's abilities pre and post surgery.  Pt is/was NWB to his RLE both before and after surgery. No Re-Eval billed for this reason with goals reviewed and remaining appropriate.  Pt pleasantly confused but with extra time and cuing was able to follow most one step commands. Pt continued to require physical assistance with all bed mobility tasks as well as to come to standing at the EOB with a RW.  Pt required heavy assist both to come to standing as well as to remain in standing with pt's R foot physically held out in knee ext to keep his R foot off of the floor.  Pt leaned heavily on the RW while in standing with flexed trunk posture but even with substantial upper body assist through the RW continued to require heavy +2 assist to remain in standing and was unable/unsafe to take any shuffling or hopping steps at this time.  Pt will benefit from continued PT services upon discharge to safely address deficits listed in patient problem list for decreased caregiver assistance and eventual return to PLOF.    If plan is discharge home, recommend the following: Two people to help with walking and/or transfers;Two people to help with bathing/dressing/bathroom;Assist for transportation;Help with stairs or ramp for entrance;Direct supervision/assist for medications  management;Direct supervision/assist for financial management;Assistance with cooking/housework;Supervision due to cognitive status   Can travel by private vehicle     No  Equipment Recommendations  Other (comment) (TBD)    Recommendations for Other Services       Precautions / Restrictions Precautions Precautions: Fall Recall of Precautions/Restrictions: Impaired Restrictions Weight Bearing Restrictions Per Provider Order: Yes RLE Weight Bearing Per Provider Order: Non weight bearing     Mobility  Bed Mobility Overal bed mobility: Needs Assistance Bed Mobility: Rolling, Sidelying to Sit, Sit to Supine Rolling: Mod assist Sidelying to sit: Mod assist   Sit to supine: +2 for physical assistance, Mod assist   General bed mobility comments: Mod multi-modal cues for log roll sequencing to get to sitting at the EOB and for use of bed rails as needed    Transfers Overall transfer level: Needs assistance Equipment used: Rolling walker (2 wheels) Transfers: Sit to/from Stand Sit to Stand: +2 physical assistance, Max assist, From elevated surface           General transfer comment: Max multi-modal cues for sequencing and for RLE WB compliance.  Pt's R foot physically held out in knee ext to ensure WB compliance. Pt required heavy +2 max A to come to standing and to maintain standing position with WB compliance maintained throughout.    Ambulation/Gait               General Gait Details: unable   Stairs             Wheelchair Mobility     Tilt Bed    Modified  Rankin (Stroke Patients Only)       Balance Overall balance assessment: Needs assistance, History of Falls Sitting-balance support: Bilateral upper extremity supported Sitting balance-Leahy Scale: Fair     Standing balance support: Bilateral upper extremity supported, Reliant on assistive device for balance Standing balance-Leahy Scale: Poor                               Communication Communication Communication: Impaired Factors Affecting Communication: Hearing impaired;Reduced clarity of speech  Cognition Arousal: Alert Behavior During Therapy: WFL for tasks assessed/performed, Flat affect   PT - Cognitive impairments: History of cognitive impairments, Awareness, Safety/Judgement, Problem solving, Sequencing, Initiation, Attention, Memory, Orientation                       PT - Cognition Comments: Pt able to state his first name only. Has known intellectual disability. Following commands: Impaired Following commands impaired: Follows one step commands inconsistently, Follows one step commands with increased time    Cueing Cueing Techniques: Verbal cues, Gestural cues, Tactile cues, Visual cues  Exercises Total Joint Exercises Hip ABduction/ADduction: AAROM, Strengthening, Both, 5 reps Straight Leg Raises: AAROM, Strengthening, 5 reps, Both Other Exercises Other Exercises: Log roll training for decreased caregiver assistance with bed mobility tasks    General Comments        Pertinent Vitals/Pain Pain Assessment Pain Assessment: No/denies pain    Home Living                          Prior Function            PT Goals (current goals can now be found in the care plan section) Progress towards PT goals: Progressing toward goals    Frequency    Min 1X/week      PT Plan      Co-evaluation              AM-PAC PT "6 Clicks" Mobility   Outcome Measure  Help needed turning from your back to your side while in a flat bed without using bedrails?: A Lot Help needed moving from lying on your back to sitting on the side of a flat bed without using bedrails?: A Lot Help needed moving to and from a bed to a chair (including a wheelchair)?: Total Help needed standing up from a chair using your arms (e.g., wheelchair or bedside chair)?: Total Help needed to walk in hospital room?: Total Help needed climbing 3-5  steps with a railing? : Total 6 Click Score: 8    End of Session Equipment Utilized During Treatment: Gait belt Activity Tolerance: Patient tolerated treatment well Patient left: in bed;with call bell/phone within reach;with bed alarm set Nurse Communication: Mobility status;Weight bearing status PT Visit Diagnosis: Other abnormalities of gait and mobility (R26.89);Muscle weakness (generalized) (M62.81);History of falling (Z91.81)     Time: 9147-8295 PT Time Calculation (min) (ACUTE ONLY): 23 min  Charges:    $Therapeutic Exercise: 8-22 mins $Therapeutic Activity: 8-22 mins PT General Charges $$ ACUTE PT VISIT: 1 Visit                    D. Scott Kaegan Stigler PT, DPT 04/08/23, 3:05 PM

## 2023-04-08 NOTE — Discharge Instructions (Signed)
 [facility]  POST OPERATIVE INSTRUCTIONS FOR DR. Ether Griffins AND DR. Taneeka Curtner [facility] PODIATRY DEPARTMENT   Take your medication as prescribed.  Pain medication should be taken only as needed.  Keep the dressing clean, dry and intact.  Maintain nonweightbearing at all times to the right lower extremity using crutches, knee scooter, walker, or wheelchair  Keep your foot elevated above the heart level for the first 48 hours.  Continue elevation thereafter to improve swelling.  May also apply ice to the top of the right ankle for maximum 10 minutes out of every 1 hour as needed for pain and swelling.  Do not take a shower. Baths are permissible as long as the foot is kept out of the water.   Every hour you are awake:  Bend your knee 15 times. Massage calf 15 times  Call [facility] [phone]) if any of the following problems occur: You develop a temperature or fever. The bandage becomes saturated with blood. Medication does not stop your pain. Injury of the foot occurs. Any symptoms of infection including redness, odor, or red streaks running from wound.

## 2023-04-08 NOTE — Plan of Care (Signed)
   Problem: Education: Goal: Knowledge of General Education information will improve Description: Including pain rating scale, medication(s)/side effects and non-pharmacologic comfort measures Outcome: Progressing   Problem: Clinical Measurements: Goal: Ability to maintain clinical measurements within normal limits will improve Outcome: Progressing Goal: Will remain free from infection Outcome: Progressing

## 2023-04-09 ENCOUNTER — Encounter: Payer: Self-pay | Admitting: Podiatry

## 2023-04-09 DIAGNOSIS — S82891A Other fracture of right lower leg, initial encounter for closed fracture: Secondary | ICD-10-CM | POA: Diagnosis not present

## 2023-04-09 DIAGNOSIS — S82841A Displaced bimalleolar fracture of right lower leg, initial encounter for closed fracture: Secondary | ICD-10-CM | POA: Diagnosis not present

## 2023-04-09 LAB — BASIC METABOLIC PANEL
Anion gap: 5 (ref 5–15)
BUN: 46 mg/dL — ABNORMAL HIGH (ref 8–23)
CO2: 29 mmol/L (ref 22–32)
Calcium: 8.1 mg/dL — ABNORMAL LOW (ref 8.9–10.3)
Chloride: 106 mmol/L (ref 98–111)
Creatinine, Ser: 1.81 mg/dL — ABNORMAL HIGH (ref 0.61–1.24)
GFR, Estimated: 40 mL/min — ABNORMAL LOW (ref >60)
Glucose, Bld: 127 mg/dL — ABNORMAL HIGH (ref 70–99)
Potassium: 3.9 mmol/L (ref 3.5–5.1)
Sodium: 140 mmol/L (ref 135–145)

## 2023-04-09 LAB — CBC
HCT: 42.2 % (ref 39.0–52.0)
Hemoglobin: 13.6 g/dL (ref 13.0–17.0)
MCH: 28.3 pg (ref 26.0–34.0)
MCHC: 32.2 g/dL (ref 30.0–36.0)
MCV: 87.7 fL (ref 80.0–100.0)
Platelets: 215 10*3/uL (ref 150–400)
RBC: 4.81 MIL/uL (ref 4.22–5.81)
RDW: 15.5 % (ref 11.5–15.5)
WBC: 11.1 10*3/uL — ABNORMAL HIGH (ref 4.0–10.5)
nRBC: 0 % (ref 0.0–0.2)

## 2023-04-09 MED ORDER — METOPROLOL SUCCINATE ER 25 MG PO TB24
25.0000 mg | ORAL_TABLET | Freq: Every day | ORAL | Status: DC
  Administered 2023-04-09 – 2023-04-12 (×4): 25 mg via ORAL
  Filled 2023-04-09 (×4): qty 1

## 2023-04-09 MED ORDER — ATORVASTATIN CALCIUM 20 MG PO TABS
40.0000 mg | ORAL_TABLET | Freq: Every day | ORAL | Status: DC
  Administered 2023-04-09 – 2023-04-11 (×3): 40 mg via ORAL
  Filled 2023-04-09 (×3): qty 2

## 2023-04-09 NOTE — TOC Progression Note (Signed)
 Transition of Care [facility]) - Progression Note    Patient Details  Name: Chad Vazquez MRN: [id number] Date of Birth: [birth year]  Transition of [facility]) CM/SW Contact  Marlowe Sax, RN Phone Number: 04/09/2023, 3:38 PM  Clinical Narrative:     Sent the bedsearch for STR, FL@ obtained, PASSR obtained       Expected Discharge Plan and Services                                               Social Determinants of Health (SDOH) Interventions SDOH Screenings   Food Insecurity: Patient Declined (04/05/2023)  Housing: Patient Declined (04/05/2023)  Transportation Needs: Patient Declined (04/05/2023)  Utilities: Patient Declined (04/05/2023)  Social Connections: Patient Declined (04/05/2023)  Tobacco Use: Low Risk  (04/05/2023)    Readmission Risk Interventions     No data to display

## 2023-04-09 NOTE — [place] FL2 (Signed)
 [place] MEDICAID FL2 LEVEL OF CARE FORM     IDENTIFICATION  Patient Name: Chad Vazquez Birthdate: Nov 27, 1955 Sex: male Admission Date (Current Location): 04/05/2023  [facility] [place] Number:  Chiropodist and Address:  [facility], [address], [place], [place] [address]      Provider Number: [id number]  Attending Physician Name and Address:  Gillis Santa, MD  Relative Name and Phone Number:  Maruice Pieroni  Brother  Emergency Contact  [phone]    Current Level of Care: Hospital Recommended Level of Care: Skilled Nursing Facility Prior Approval Number:    Date Approved/Denied:   PASRR Number: [id number]  Discharge Plan: SNF    Current Diagnoses: Patient Active Problem List   Diagnosis Date Noted   Ankle fracture 04/05/2023   CKD (chronic kidney disease) 04/05/2023   Colon polyps 05/22/2017   HTN (hypertension) 05/22/2017   Hypothyroidism, unspecified 05/22/2017   Neck malignant neoplasm (HCC) 05/22/2017    Orientation RESPIRATION BLADDER Height & Weight     Self, Time, Situation, Place  Normal (Regular) Incontinent Weight: 86.2 kg Height:  5\' 10"  (177.8 cm)  BEHAVIORAL SYMPTOMS/MOOD NEUROLOGICAL BOWEL NUTRITION STATUS      Continent Diet  AMBULATORY STATUS COMMUNICATION OF NEEDS Skin   Extensive Assist Verbally Normal, Surgical wounds                       Personal Care Assistance Level of Assistance  Bathing, Feeding, Dressing Bathing Assistance: Maximum assistance Feeding assistance: Limited assistance Dressing Assistance: Maximum assistance     Functional Limitations Info  Sight, Hearing, Speech Sight Info: Adequate Hearing Info: Adequate Speech Info: Adequate    SPECIAL CARE FACTORS FREQUENCY  PT (By licensed PT), OT (By licensed OT)     PT Frequency: 5 times per week OT Frequency: 5 times per week            Contractures Contractures Info: Not present    Additional Factors Info   Code Status, Allergies Code Status Info: full code Allergies Info: NKDA           Current Medications (04/09/2023):  This is the current hospital active medication list Current Facility-Administered Medications  Medication Dose Route Frequency Provider Last Rate Last Admin   acetaminophen (TYLENOL) tablet 650 mg  650 mg Oral Q6H PRN Rosetta Posner, DPM   650 mg at 04/08/23 8469   Or   acetaminophen (TYLENOL) suppository 650 mg  650 mg Rectal Q6H PRN Rosetta Posner, DPM       albuterol (PROVENTIL) (2.5 MG/3ML) 0.083% nebulizer solution 2.5 mg  2.5 mg Inhalation Q6H PRN Rosetta Posner, DPM       bisacodyl (DULCOLAX) EC tablet 10 mg  10 mg Oral QHS Gillis Santa, MD   10 mg at 04/08/23 2126   bisacodyl (DULCOLAX) suppository 10 mg  10 mg Rectal Daily PRN Gillis Santa, MD       cyanocobalamin (VITAMIN B12) injection 1,000 mcg  1,000 mcg Intramuscular Daily Gillis Santa, MD   1,000 mcg at 04/09/23 0901   Followed by   Melene Muller ON 04/13/2023] cyanocobalamin (VITAMIN B12) tablet 1,000 mcg  1,000 mcg Oral Daily Gillis Santa, MD       enoxaparin (LOVENOX) injection 40 mg  40 mg Subcutaneous Q24H Rosetta Posner, DPM   40 mg at 04/09/23 0902   levothyroxine (SYNTHROID) tablet 75 mcg  75 mcg Oral Q0600 Rosetta Posner, DPM   75 mcg at 04/09/23 [id number]  morphine (PF) 4 MG/ML injection 4 mg  4 mg Intravenous Q4H PRN Rosetta Posner, DPM   4 mg at 04/09/23 1525   ondansetron (ZOFRAN) tablet 4 mg  4 mg Oral Q6H PRN Rosetta Posner, DPM       Or   ondansetron (ZOFRAN) injection 4 mg  4 mg Intravenous Q6H PRN Rosetta Posner, DPM       oxyCODONE (Oxy IR/ROXICODONE) immediate release tablet 5 mg  5 mg Oral Q6H PRN Rosetta Posner, DPM   5 mg at 04/08/23 2128   polyethylene glycol (MIRALAX / GLYCOLAX) packet 17 g  17 g Oral BID Gillis Santa, MD   17 g at 04/09/23 0900   Vitamin D (Ergocalciferol) (DRISDOL) 1.25 MG (50000 UNIT) capsule 50,000 Units  50,000 Units Oral Q7 days Gillis Santa, MD   50,000 Units at 04/07/23 1638      Discharge Medications: Please see discharge summary for a list of discharge medications.  Relevant Imaging Results:  Relevant Lab Results:   Additional Information SS# [id number]  Marlowe Sax, RN

## 2023-04-09 NOTE — Plan of Care (Signed)
   Problem: Education: Goal: Knowledge of General Education information will improve Description: Including pain rating scale, medication(s)/side effects and non-pharmacologic comfort measures Outcome: Progressing   Problem: Clinical Measurements: Goal: Will remain free from infection Outcome: Progressing Goal: Diagnostic test results will improve Outcome: Progressing Goal: Respiratory complications will improve Outcome: Progressing   Problem: Nutrition: Goal: Adequate nutrition will be maintained Outcome: Progressing

## 2023-04-09 NOTE — Progress Notes (Signed)
 [facility] Progress Note  Patient: Chad Vazquez    XBJ:[id number]  DOA: 04/05/2023     Date of Service: the patient was seen and examined on 04/09/2023  Chief Complaint  Patient presents with   Fall   Brief hospital course: JARRIS KORTZ is a 68 y.o. male with medical history significant of hypertension, intellectual disability, hypothyroidism presenting with mechanical fall, right ankle fracture.  Patient with noted baseline intellectual disability.  Also with intermittent gait instability.  Has to hold onto furniture walk around house.  Per report, patient with fall, shallow.  No reported weakness or dizziness prior to event.  Fall was not witnessed.  At present, patient denies any headache.  No chest pain or shortness of breath.  Has significant right ankle pain after fall. Presented to the ER afebrile, hemodynamically stable.  Satting well on room air.  White count 11.7, hemoglobin 15.2, platelets 196, creatinine 1.9.  Glucose 157.  CT head and CT C-spine grossly stable.  Right ankle plain films with displaced distal fibular and medial malleolus fracture.   Assessment and Plan:  # Right ankle bimalleolar fracture closed, displaced # Right ankle deltoid ligament rupture S/p Mechanical Fall  Xray: R fibular fracture s/p mechanical fall at home (fell getting out of shower)  Orthopedic surgery consulted, s/p Right bimalleolar ankle fracture open reduction with internal fixation and Repair of right ankle deltoid ligament done on 3/2 Podiatry recommended to follow-up in 2 weeks for evaluation of incision and suture removal Continue none weightbearing right lower extremity for 6 weeks, Aspirin 81 mg p.o. twice daily and as needed meds for pain control.  Podiatry signed off. Continue prn meds for pain control Continue fall precautions PT and OT evaluation done, Rec SNF placement TOC following, awaiting for SNF placement      CKD (chronic kidney disease) Cr 1.9 w/ GFR 30s on  presentation  Suspect this may be near baseline in review of prior renal function roughly 5 years ago  Will monitor renal function  Minimize nephrotoxic agents  Follow   Hypothyroidism, unspecified Cont synthroid      HTN (hypertension) and HLD Resumed statin Started low-dose Toprol-XL 25 mg p.o. daily Held losartan for now BP and titrate medications accordingly    Vitamin D level 36, started vitamin D 50,000 units p.o. weekly to prevent vitamin D deficiency Vitamin B12 level 246, goal >400, started vitamin B12 1000 mcg IM injection during hospital stay followed by oral supplement. Repeat vitamin D and B12 level after 3 to 6 months.   Body mass index is 27.26 kg/m.  Interventions:  Diet: Regular diet DVT Prophylaxis: Subcutaneous Lovenox   Advance goals of care discussion: Full code  Family Communication: family was not present at bedside, at the time of interview.  Patient is AAO x 1   Disposition:  Pt is from Home, admitted with Fall and right ankle fracture, s/p repair done on 3/2, Needs PT and OT eval for possible SNF placement.  Medically stable to discharge to SNF when bed will be available. Follow TOC for disposition plan  Subjective: No significant events overnight, patient is AO x 1, knows only his first name.  Resting comfortably without any acute distress seems pain is under control.    Physical Exam: General: NAD, lying comfortably Appear in no distress, affect appropriate Eyes: PERRLA ENT: Oral Mucosa Clear, moist  Neck: no JVD,  Cardiovascular: S1 and S2 Present, no Murmur,  Respiratory: good respiratory effort, Bilateral Air entry equal and  Decreased, no Crackles, no wheezes Abdomen: Bowel Sound present, Soft and no tenderness,  Skin: no rashes Extremities: Right ankle postop dressing intact, LLE No pedal edema, no calf tenderness Neurologic: without any new focal findings Gait not checked due to patient safety concerns  Vitals:   04/08/23  1610 04/08/23 1940 04/08/23 2321 04/09/23 0808  BP: (!) 147/90 127/75 (!) 161/94 134/84  Pulse: 78 82 85 73  Resp: 16 16 16 16   Temp: (!) 97.5 F (36.4 C) 98 F (36.7 C) 98.3 F (36.8 C) 97.6 F (36.4 C)  TempSrc: Oral     SpO2: 94% 96% 95% 98%  Weight:      Height:       No intake or output data in the 24 hours ending 04/09/23 1605  Filed Weights   04/05/23 0325  Weight: 86.2 kg    Data Reviewed: I have personally reviewed and interpreted daily labs, tele strips, imagings as discussed above. I reviewed all nursing notes, pharmacy notes, vitals, pertinent old records I have discussed plan of care as described above with RN and patient/family.  CBC: Recent Labs  Lab 04/05/23 0330 04/06/23 0702 04/07/23 0530 04/08/23 0210 04/09/23 0600  WBC 11.7* 8.8 8.1 11.2* 11.1*  NEUTROABS 9.4*  --   --   --   --   HGB 15.2 14.1 13.5 13.2 13.6  HCT 46.8 43.1 41.5 39.6 42.2  MCV 87.0 87.4 87.4 88.8 87.7  PLT 196 181 165 193 215   Basic Metabolic Panel: Recent Labs  Lab 04/05/23 0330 04/06/23 0702 04/07/23 0530 04/08/23 0210 04/09/23 0600  NA 139 141 140 137 140  K 3.9 4.2 3.6 4.0 3.9  CL 104 108 106 103 106  CO2 24 27 28 25 29   GLUCOSE 157* 133* 120* 232* 127*  BUN 32* 38* 41* 44* 46*  CREATININE 1.87* 1.90* 1.78* 1.87* 1.81*  CALCIUM 9.0 8.7* 8.4* 7.9* 8.1*  MG  --  2.3 2.3  --   --   PHOS  --  3.6 3.2  --   --     Studies: No results found.   Scheduled Meds:  bisacodyl  10 mg Oral QHS   cyanocobalamin  1,000 mcg Intramuscular Daily   Followed by   Melene Muller ON 04/13/2023] vitamin B-12  1,000 mcg Oral Daily   enoxaparin (LOVENOX) injection  40 mg Subcutaneous Q24H   levothyroxine  75 mcg Oral Q0600   polyethylene glycol  17 g Oral BID   Vitamin D (Ergocalciferol)  50,000 Units Oral Q7 days   Continuous Infusions: PRN Meds: acetaminophen **OR** acetaminophen, albuterol, bisacodyl, morphine injection, ondansetron **OR** ondansetron (ZOFRAN) IV, oxyCODONE  Time  spent: 35 minutes  Author: Gillis Santa. MD [facility] 04/09/2023 4:05 PM  To reach On-call, see care teams to locate the attending and reach out to them via [email or web address]. If 7PM-7AM, please contact night-coverage If you still have difficulty reaching the attending provider, please page the [facility] (Director on Call) for [facility] on amion for assistance.

## 2023-04-09 NOTE — Progress Notes (Signed)
 Mobility Specialist - Progress Note   04/09/23 1053  Mobility  Activity Stood at bedside;Dangled on edge of bed  Level of Assistance +2 (takes two people) (MinA of two)  Location manager Ambulated (ft) 1 ft  RLE Weight Bearing Per Provider Order NWB  Activity Response Tolerated well  Mobility visit 1 Mobility  Mobility Specialist Start Time (ACUTE ONLY) [id number]  Mobility Specialist Stop Time (ACUTE ONLY) 0957  Mobility Specialist Time Calculation (min) (ACUTE ONLY) 18 min   Pt supine upon entry, utilizing RA. Pt agreeable to activity this date. Pt completed bed mob ModA to bring trunk from sup to sit, MinA to bring BLE EOB--- able to complete simple one step commands with extra time. Pt STS to RW MinA +2, requiring MS to position one foot under the RLE to remain NWB--- approximately 15% on the affected limb, gradually becoming NWB with weight shift. Pt returned seated and completed an additional two STS to the RW MinA +2, each time requiring less assistance for NWB of the RLE. Pt able to slide/shift LLE towards the [facility], requring ModA +2 for support. Pt denied pain in LE, however expressed pain in lower back upon return to bed. Pt left simi fowler with alarm set and needs within reach. RN notified.  Zetta Bills Mobility Specialist 04/09/23 11:08 AM

## 2023-04-09 NOTE — Plan of Care (Signed)
  Problem: Clinical Measurements: Goal: Ability to maintain clinical measurements within normal limits will improve Outcome: Progressing   Problem: Nutrition: Goal: Adequate nutrition will be maintained Outcome: Progressing   Problem: Activity: Goal: Risk for activity intolerance will decrease Outcome: Progressing   Problem: Pain Managment: Goal: General experience of comfort will improve and/or be controlled Outcome: Progressing   Problem: Skin Integrity: Goal: Risk for impaired skin integrity will decrease Outcome: Progressing

## 2023-04-09 NOTE — Progress Notes (Signed)
 Physical Therapy Treatment Patient Details Name: Chad Vazquez MRN: [id number] DOB: October 11, 1955 Today's Date: 04/09/2023   History of Present Illness Pt is a 68 y.o. male with medical history significant of hypertension, intellectual disability, CKD, hypothyroidism who presents with right ankle pain after sustaining 2 falls.  Patient has a history of intellectual disability and gait instability. Pt diagnosed with bimalleolar right ankle fracture and is now s/p R ankle ORIF and ligament repair.    PT Comments  Pt pleasant but limited by pain this session and declined mobility.  Pt agreed to below supine therex and put forth good effort including with manual resistance when appropriate. Attempted RLE movement/therex but even with limited amplitude PROM/AAROM at the knee and hip the pt was unable to tolerate much due to pain, nursing notified.  Pt will benefit from continued PT services upon discharge to safely address deficits listed in patient problem list for decreased caregiver assistance and eventual return to PLOF.    If plan is discharge home, recommend the following: Two people to help with walking and/or transfers;Two people to help with bathing/dressing/bathroom;Assist for transportation;Help with stairs or ramp for entrance;Direct supervision/assist for medications management;Direct supervision/assist for financial management;Assistance with cooking/housework;Supervision due to cognitive status   Can travel by private vehicle     No  Equipment Recommendations  Other (comment) (TBD)    Recommendations for Other Services       Precautions / Restrictions Precautions Precautions: Fall Recall of Precautions/Restrictions: Impaired Restrictions Weight Bearing Restrictions Per Provider Order: Yes RLE Weight Bearing Per Provider Order: Non weight bearing     Mobility  Bed Mobility               General bed mobility comments: Pt declined mobility this session secondary to RLE  pain, agreed to below therex    Transfers                        Ambulation/Gait                   Stairs             Wheelchair Mobility     Tilt Bed    Modified Rankin (Stroke Patients Only)       Balance                                            Communication Communication Communication: Impaired Factors Affecting Communication: Hearing impaired;Reduced clarity of speech  Cognition Arousal: Alert Behavior During Therapy: Flat affect   PT - Cognitive impairments: History of cognitive impairments, Awareness, Safety/Judgement, Problem solving, Sequencing, Initiation, Attention, Memory, Orientation                       PT - Cognition Comments: Pt able to state his first name only. Has known intellectual disability. Following commands: Impaired Following commands impaired: Follows one step commands with increased time    Cueing Cueing Techniques: Verbal cues, Gestural cues, Tactile cues, Visual cues  Exercises Total Joint Exercises Ankle Circles/Pumps: Strengthening, Both, 10 reps, 15 reps (with manual resistance) Quad Sets: Strengthening, Left, 10 reps, 15 reps Short Arc Quad: Strengthening, Left, 10 reps, 15 reps (with manual resistance) Heel Slides: Strengthening, Left, 10 reps, 15 reps Hip ABduction/ADduction: AAROM, Strengthening, Left, 10 reps, 15 reps (Gently PROM/AAROM on the RLE x 5,  limited by pain) Straight Leg Raises: AAROM, Strengthening, Left, 10 reps, 15 reps (Gently PROM/AAROM on the RLE x 5, limited by pain) Other Exercises Other Exercises: LLE leg press 2 x 10 with manual resistance    General Comments        Pertinent Vitals/Pain Pain Assessment Pain Assessment: 0-10 Faces Pain Scale: Hurts even more Pain Location: R lower leg Pain Descriptors / Indicators: Other (Comment) (Pt unable to describe) Pain Intervention(s): Limited activity within patient's tolerance, Monitored during session,  Ice applied, Patient requesting pain meds-RN notified    Home Living                          Prior Function            PT Goals (current goals can now be found in the care plan section) Progress towards PT goals: PT to reassess next treatment    Frequency    Min 2X/week      PT Plan      Co-evaluation              AM-PAC PT "6 Clicks" Mobility   Outcome Measure  Help needed turning from your back to your side while in a flat bed without using bedrails?: A Lot Help needed moving from lying on your back to sitting on the side of a flat bed without using bedrails?: A Lot Help needed moving to and from a bed to a chair (including a wheelchair)?: Total Help needed standing up from a chair using your arms (e.g., wheelchair or bedside chair)?: Total Help needed to walk in hospital room?: Total Help needed climbing 3-5 steps with a railing? : Total 6 Click Score: 8    End of Session   Activity Tolerance: Patient limited by pain Patient left: in bed;with call bell/phone within reach;with bed alarm set Nurse Communication: Mobility status;Weight bearing status;Patient requests pain meds PT Visit Diagnosis: Other abnormalities of gait and mobility (R26.89);Muscle weakness (generalized) (M62.81);History of falling (Z91.81)     Time: 1457-1520 PT Time Calculation (min) (ACUTE ONLY): 23 min  Charges:    $Therapeutic Exercise: 23-37 mins PT General Charges $$ ACUTE PT VISIT: 1 Visit                    D. Scott Marquez C[place] PT, DPT 04/09/23, 3:22 PM

## 2023-04-10 DIAGNOSIS — S82841A Displaced bimalleolar fracture of right lower leg, initial encounter for closed fracture: Secondary | ICD-10-CM | POA: Diagnosis not present

## 2023-04-10 DIAGNOSIS — S82891A Other fracture of right lower leg, initial encounter for closed fracture: Secondary | ICD-10-CM | POA: Diagnosis not present

## 2023-04-10 LAB — CBC
HCT: 42.4 % (ref 39.0–52.0)
Hemoglobin: 13.6 g/dL (ref 13.0–17.0)
MCH: 28.2 pg (ref 26.0–34.0)
MCHC: 32.1 g/dL (ref 30.0–36.0)
MCV: 87.8 fL (ref 80.0–100.0)
Platelets: 197 10*3/uL (ref 150–400)
RBC: 4.83 MIL/uL (ref 4.22–5.81)
RDW: 15.6 % — ABNORMAL HIGH (ref 11.5–15.5)
WBC: 9.2 10*3/uL (ref 4.0–10.5)
nRBC: 0 % (ref 0.0–0.2)

## 2023-04-10 LAB — BASIC METABOLIC PANEL
Anion gap: 5 (ref 5–15)
BUN: 43 mg/dL — ABNORMAL HIGH (ref 8–23)
CO2: 26 mmol/L (ref 22–32)
Calcium: 8 mg/dL — ABNORMAL LOW (ref 8.9–10.3)
Chloride: 107 mmol/L (ref 98–111)
Creatinine, Ser: 1.66 mg/dL — ABNORMAL HIGH (ref 0.61–1.24)
GFR, Estimated: 45 mL/min — ABNORMAL LOW (ref >60)
Glucose, Bld: 146 mg/dL — ABNORMAL HIGH (ref 70–99)
Potassium: 4.1 mmol/L (ref 3.5–5.1)
Sodium: 138 mmol/L (ref 135–145)

## 2023-04-10 MED ORDER — BISACODYL 10 MG RE SUPP
10.0000 mg | Freq: Once | RECTAL | Status: AC
  Administered 2023-04-10: 10 mg via RECTAL
  Filled 2023-04-10: qty 1

## 2023-04-10 MED ORDER — SMOG ENEMA
960.0000 mL | Freq: Once | RECTAL | Status: DC
  Filled 2023-04-10: qty 960

## 2023-04-10 NOTE — Progress Notes (Signed)
 [facility] Progress Note  Patient: Chad Vazquez    ZOX:[id number]  DOA: 04/05/2023     Date of Service: the patient was seen and examined on 04/10/2023  Chief Complaint  Patient presents with   Fall   Brief hospital course: Chad Vazquez is a 68 y.o. male with medical history significant of hypertension, intellectual disability, hypothyroidism presenting with mechanical fall, right ankle fracture.  Patient with noted baseline intellectual disability.  Also with intermittent gait instability.  Has to hold onto furniture walk around house.  Per report, patient with fall, shallow.  No reported weakness or dizziness prior to event.  Fall was not witnessed.  At present, patient denies any headache.  No chest pain or shortness of breath.  Has significant right ankle pain after fall. Presented to the ER afebrile, hemodynamically stable.  Satting well on room air.  White count 11.7, hemoglobin 15.2, platelets 196, creatinine 1.9.  Glucose 157.  CT head and CT C-spine grossly stable.  Right ankle plain films with displaced distal fibular and medial malleolus fracture.   Assessment and Plan:  # Right ankle bimalleolar fracture closed, displaced # Right ankle deltoid ligament rupture S/p Mechanical Fall  Xray: R fibular fracture s/p mechanical fall at home (fell getting out of shower)  Orthopedic surgery consulted, s/p Right bimalleolar ankle fracture open reduction with internal fixation and Repair of right ankle deltoid ligament done on 3/2 Podiatry recommended to follow-up in 2 weeks for evaluation of incision and suture removal Continue none weightbearing right lower extremity for 6 weeks, Aspirin 81 mg p.o. twice daily and as needed meds for pain control.  Podiatry signed off. Continue prn meds for pain control Continue fall precautions PT and OT evaluation done, Rec SNF placement TOC following, awaiting for SNF placement      # CKD (chronic kidney disease) Cr 1.9 w/ GFR 30s on  presentation  Suspect this may be near baseline in review of prior renal function roughly 5 years ago  Will monitor renal function  Minimize nephrotoxic agents  Follow   # Hypothyroidism, unspecified Cont synthroid      # HTN (hypertension) and HLD Resumed statin Started low-dose Toprol-XL 25 mg p.o. daily Held losartan for now BP and titrate medications accordingly    # Vitamin D level 36, started vitamin D 50,000 units p.o. weekly to prevent vitamin D deficiency # Vitamin B12 level 246, goal >400, started vitamin B12 1000 mcg IM injection during hospital stay followed by oral supplement. Repeat vitamin D and B12 level after 3 to 6 months.  # Constipation, started laxatives, use as needed suppository and enema.   Body mass index is 27.26 kg/m.  Interventions:  Diet: Regular diet DVT Prophylaxis: Subcutaneous Lovenox   Advance goals of care discussion: Full code  Family Communication: family was not present at bedside, at the time of interview.  Patient is AAO x 1   Disposition:  Pt is from Home, admitted with Fall and right ankle fracture, s/p repair done on 3/2, Needs PT and OT eval for possible SNF placement.  Medically stable to discharge to SNF when bed will be available. Follow TOC for disposition plan  Subjective: No significant events overnight, patient is AAO x 1, knows his first name only.  Resting comfortably without any acute distress seems pain is under control.    Physical Exam: General: NAD, lying comfortably Appear in no distress, affect appropriate Eyes: PERRLA ENT: Oral Mucosa Clear, moist  Neck: no JVD,  Cardiovascular:  S1 and S2 Present, no Murmur,  Respiratory: good respiratory effort, Bilateral Air entry equal and Decreased, no Crackles, no wheezes Abdomen: Bowel Sound present, Soft and no tenderness,  Skin: no rashes Extremities: Right ankle postop dressing intact, LLE No pedal edema, no calf tenderness Neurologic: without any new focal  findings Gait not checked due to patient safety concerns  Vitals:   04/09/23 0808 04/09/23 1620 04/09/23 2126 04/10/23 0754  BP: 134/84 (!) 153/98 (!) 162/79 113/62  Pulse: 73 89 78 76  Resp: 16 16 16 16   Temp: 97.6 F (36.4 C) 98 F (36.7 C) 97.9 F (36.6 C) 98.3 F (36.8 C)  TempSrc:      SpO2: 98% 93% 95% 93%  Weight:      Height:        Intake/Output Summary (Last 24 hours) at 04/10/2023 1124 Last data filed at 04/10/2023 1610 Gross per 24 hour  Intake --  Output 600 ml  Net -600 ml    Filed Weights   04/05/23 0325  Weight: 86.2 kg    Data Reviewed: I have personally reviewed and interpreted daily labs, tele strips, imagings as discussed above. I reviewed all nursing notes, pharmacy notes, vitals, pertinent old records I have discussed plan of care as described above with RN and patient/family.  CBC: Recent Labs  Lab 04/05/23 0330 04/06/23 0702 04/07/23 0530 04/08/23 0210 04/09/23 0600 04/10/23 0455  WBC 11.7* 8.8 8.1 11.2* 11.1* 9.2  NEUTROABS 9.4*  --   --   --   --   --   HGB 15.2 14.1 13.5 13.2 13.6 13.6  HCT 46.8 43.1 41.5 39.6 42.2 42.4  MCV 87.0 87.4 87.4 88.8 87.7 87.8  PLT 196 181 165 193 215 197   Basic Metabolic Panel: Recent Labs  Lab 04/06/23 0702 04/07/23 0530 04/08/23 0210 04/09/23 0600 04/10/23 0455  NA 141 140 137 140 138  K 4.2 3.6 4.0 3.9 4.1  CL 108 106 103 106 107  CO2 27 28 25 29 26   GLUCOSE 133* 120* 232* 127* 146*  BUN 38* 41* 44* 46* 43*  CREATININE 1.90* 1.78* 1.87* 1.81* 1.66*  CALCIUM 8.7* 8.4* 7.9* 8.1* 8.0*  MG 2.3 2.3  --   --   --   PHOS 3.6 3.2  --   --   --     Studies: No results found.   Scheduled Meds:  atorvastatin  40 mg Oral QHS   bisacodyl  10 mg Oral QHS   cyanocobalamin  1,000 mcg Intramuscular Daily   Followed by   Melene Muller ON 04/13/2023] vitamin B-12  1,000 mcg Oral Daily   enoxaparin (LOVENOX) injection  40 mg Subcutaneous Q24H   levothyroxine  75 mcg Oral Q0600   metoprolol succinate  25 mg  Oral Daily   polyethylene glycol  17 g Oral BID   Vitamin D (Ergocalciferol)  50,000 Units Oral Q7 days   Continuous Infusions: PRN Meds: acetaminophen **OR** acetaminophen, albuterol, bisacodyl, morphine injection, ondansetron **OR** ondansetron (ZOFRAN) IV, oxyCODONE  Time spent: 35 minutes  Author: Gillis Santa. MD [facility] 04/10/2023 11:24 AM  To reach On-call, see care teams to locate the attending and reach out to them via [email or web address]. If 7PM-7AM, please contact night-coverage If you still have difficulty reaching the attending provider, please page the [facility] (Director on Call) for [facility] on amion for assistance.

## 2023-04-10 NOTE — TOC Progression Note (Addendum)
 Transition of Care [facility]) - Progression Note    Patient Details  Name: REISS MOWREY MRN: [id number] Date of Birth: 10-Feb-1955  [facility]) CM/SW Contact  Marlowe Sax, RN Phone Number: 04/10/2023, 2:44 PM  Clinical Narrative:     Alpha Gula who is the legal guardian We reviewed bed offers and medicare star rating, he is very anxious and asked that his daughter in law call me that she is more familiar with these things and will call me to make a choice, I explained that we need to have their choice ASAP since the patient is observation status and will acru copays for each day while here, also I explained that the facilities do not hold a bed until we accept it and start Ins auth, he will have her call me or he will call me back himself   I received a call from [place] the daughter in law and reviewed the bed offers and the star rating  She stated that due to location and past experience she would choose [facility] I notified [facility] I explained that EMS will not be covered under Ins due to being ambulatory She stated that she will need notice to be able to pick him up and take him I explained it would need to be same day that he is discharged  She stated that she  can be reached at [phone]     Expected Discharge Plan and Services                                               Social Determinants of Health (SDOH) Interventions SDOH Screenings   Food Insecurity: Patient Declined (04/05/2023)  Housing: Patient Declined (04/05/2023)  Transportation Needs: Patient Declined (04/05/2023)  Utilities: Patient Declined (04/05/2023)  Social Connections: Patient Declined (04/05/2023)  Tobacco Use: Low Risk  (04/05/2023)    Readmission Risk Interventions     No data to display

## 2023-04-10 NOTE — Plan of Care (Signed)

## 2023-04-10 NOTE — Progress Notes (Signed)
 Physical Therapy Treatment Patient Details Name: Chad Vazquez MRN: [id number] DOB: [birth year] Today's Date: 04/10/2023   History of Present Illness Pt is a 68 y.o. male with medical history significant of hypertension, intellectual disability, CKD, hypothyroidism who presents with right ankle pain after sustaining 2 falls.  Patient has a history of intellectual disability and gait instability. Pt diagnosed with bimalleolar right ankle fracture and is now s/p R ankle ORIF and ligament repair.    PT Comments  Pt was pleasant and motivated to participate during the session and put forth good effort throughout. Pt required physical assist with all bed mobility tasks for RLE and trunk management.  Pt required heavy +2 assist to come to standing from an elevated EOB while pulling up from the sink, R foot held out with this PT's foot to ensure WB compliance.  Once in standing pt continue to require heavy assist to maintain standing position with max standing tolerance around 20-30 sec for each of two bouts of standing.  Pt reported only "a little" pain in his R ankle this session with no apparent distress during functional training.  Pt will benefit from continued PT services upon discharge to safely address deficits listed in patient problem list for decreased caregiver assistance and eventual return to PLOF.      If plan is discharge home, recommend the following: Two people to help with walking and/or transfers;Two people to help with bathing/dressing/bathroom;Assist for transportation;Help with stairs or ramp for entrance;Direct supervision/assist for medications management;Direct supervision/assist for financial management;Assistance with cooking/housework;Supervision due to cognitive status   Can travel by private vehicle     No  Equipment Recommendations  Other (comment) (TBD)    Recommendations for Other Services       Precautions / Restrictions Precautions Precautions: Fall Recall of  Precautions/Restrictions: Impaired Restrictions Weight Bearing Restrictions Per Provider Order: Yes RLE Weight Bearing Per Provider Order: Non weight bearing     Mobility  Bed Mobility Overal bed mobility: Needs Assistance Bed Mobility: Rolling, Sidelying to Sit, Sit to Supine Rolling: Mod assist Sidelying to sit: Mod assist   Sit to supine: +2 for physical assistance, Mod assist   General bed mobility comments: Assist needed for RLE and trunk management    Transfers Overall transfer level: Needs assistance Equipment used:  (Pulling up from and stabilizing with sink/counter) Transfers: Sit to/from Stand Sit to Stand: +2 physical assistance, Max assist, From elevated surface           General transfer comment: Pt required heavy +2 assist to come to standing from an elevated EOB while pulling up from the sink, R foot held out with this PT's foot to ensure WB compliance    Ambulation/Gait               General Gait Details: unable   Stairs             Wheelchair Mobility     Tilt Bed    Modified Rankin (Stroke Patients Only)       Balance Overall balance assessment: Needs assistance, History of Falls Sitting-balance support: Bilateral upper extremity supported Sitting balance-Leahy Scale: Fair Sitting balance - Comments: Mild left lateral lean intially but able to maintain balance without assist with left lateral lean easily corrected   Standing balance support: Bilateral upper extremity supported, Reliant on assistive device for balance Standing balance-Leahy Scale: Poor  Communication Communication Communication: Impaired Factors Affecting Communication: Hearing impaired;Reduced clarity of speech  Cognition Arousal: Alert Behavior During Therapy: Flat affect   PT - Cognitive impairments: History of cognitive impairments, Awareness, Safety/Judgement, Problem solving, Sequencing, Initiation, Attention,  Memory, Orientation                       PT - Cognition Comments: Pt able to state his first name only. Has known intellectual disability. Following commands: Impaired Following commands impaired: Follows one step commands with increased time    Cueing Cueing Techniques: Verbal cues, Gestural cues, Tactile cues, Visual cues  Exercises Total Joint Exercises Ankle Circles/Pumps: Strengthening, 10 reps, 15 reps, Left (toe curls on R foot) Quad Sets: Strengthening, Left, 10 reps, 15 reps Hip ABduction/ADduction: AAROM, Strengthening, Left, 10 reps Straight Leg Raises: AAROM, Strengthening, Left, 10 reps Other Exercises Other Exercises: Static standing on LLE 2 x 20-30 sec with heavy lean on the sink counter for support, +2 min to mod A for stability, and R foot held out to prevent WB    General Comments        Pertinent Vitals/Pain Pain Assessment Pain Assessment: Faces Faces Pain Scale: Hurts a little bit    Home Living                          Prior Function            PT Goals (current goals can now be found in the care plan section) Progress towards PT goals: Progressing toward goals    Frequency    Min 3X/week      PT Plan      Co-evaluation              AM-PAC PT "6 Clicks" Mobility   Outcome Measure  Help needed turning from your back to your side while in a flat bed without using bedrails?: A Lot Help needed moving from lying on your back to sitting on the side of a flat bed without using bedrails?: A Lot Help needed moving to and from a bed to a chair (including a wheelchair)?: Total Help needed standing up from a chair using your arms (e.g., wheelchair or bedside chair)?: Total Help needed to walk in hospital room?: Total Help needed climbing 3-5 steps with a railing? : Total 6 Click Score: 8    End of Session Equipment Utilized During Treatment: Gait belt Activity Tolerance: Patient tolerated treatment well Patient left:  in bed;with call bell/phone within reach;with bed alarm set Nurse Communication: Mobility status;Weight bearing status PT Visit Diagnosis: Other abnormalities of gait and mobility (R26.89);Muscle weakness (generalized) (M62.81);History of falling (Z91.81);Pain Pain - Right/Left: Right Pain - part of body: Ankle and joints of foot     Time: 5784-6962 PT Time Calculation (min) (ACUTE ONLY): 21 min  Charges:    $Therapeutic Activity: 8-22 mins PT General Charges $$ ACUTE PT VISIT: 1 Visit                    D. Elly Modena PT, DPT 04/10/23, 4:26 PM

## 2023-04-10 NOTE — TOC Progression Note (Signed)
 Transition of Care [facility]) - Progression Note    Patient Details  Name: Chad Vazquez MRN: [id number] Date of Birth: 02-Aug-1955  Transition of Care [facility]) CM/SW Contact  Marlowe Sax, RN Phone Number: 04/10/2023, 4:40 PM  Clinical Narrative:      Pt is pending plan ID [id number]       Expected Discharge Plan and Services                                               Social Determinants of Health (SDOH) Interventions SDOH Screenings   Food Insecurity: Patient Declined (04/05/2023)  Housing: Patient Declined (04/05/2023)  Transportation Needs: Patient Declined (04/05/2023)  Utilities: Patient Declined (04/05/2023)  Social Connections: Patient Declined (04/05/2023)  Tobacco Use: Low Risk  (04/05/2023)    Readmission Risk Interventions     No data to display

## 2023-04-11 DIAGNOSIS — S82841A Displaced bimalleolar fracture of right lower leg, initial encounter for closed fracture: Secondary | ICD-10-CM | POA: Diagnosis not present

## 2023-04-11 DIAGNOSIS — S82891A Other fracture of right lower leg, initial encounter for closed fracture: Secondary | ICD-10-CM | POA: Diagnosis not present

## 2023-04-11 LAB — BASIC METABOLIC PANEL
Anion gap: 9 (ref 5–15)
BUN: 38 mg/dL — ABNORMAL HIGH (ref 8–23)
CO2: 26 mmol/L (ref 22–32)
Calcium: 8.1 mg/dL — ABNORMAL LOW (ref 8.9–10.3)
Chloride: 103 mmol/L (ref 98–111)
Creatinine, Ser: 1.54 mg/dL — ABNORMAL HIGH (ref 0.61–1.24)
GFR, Estimated: 49 mL/min — ABNORMAL LOW (ref >60)
Glucose, Bld: 156 mg/dL — ABNORMAL HIGH (ref 70–99)
Potassium: 4.2 mmol/L (ref 3.5–5.1)
Sodium: 138 mmol/L (ref 135–145)

## 2023-04-11 LAB — CBC
HCT: 43.1 % (ref 39.0–52.0)
Hemoglobin: 14.1 g/dL (ref 13.0–17.0)
MCH: 28.3 pg (ref 26.0–34.0)
MCHC: 32.7 g/dL (ref 30.0–36.0)
MCV: 86.4 fL (ref 80.0–100.0)
Platelets: 220 10*3/uL (ref 150–400)
RBC: 4.99 MIL/uL (ref 4.22–5.81)
RDW: 15.1 % (ref 11.5–15.5)
WBC: 9.5 10*3/uL (ref 4.0–10.5)
nRBC: 0 % (ref 0.0–0.2)

## 2023-04-11 NOTE — Progress Notes (Signed)
 [facility] Progress Note  Patient: Chad Vazquez    VHQ:[id number]  DOA: 04/05/2023     Date of Service: the patient was seen and examined on 04/11/2023  Chief Complaint  Patient presents with   Fall   Brief hospital course: Chad Vazquez is a 68 y.o. male with medical history significant of hypertension, intellectual disability, hypothyroidism presenting with mechanical fall, right ankle fracture.  Patient with noted baseline intellectual disability.  Also with intermittent gait instability.  Has to hold onto furniture walk around house.  Per report, patient with fall, shallow.  No reported weakness or dizziness prior to event.  Fall was not witnessed.  At present, patient denies any headache.  No chest pain or shortness of breath.  Has significant right ankle pain after fall. Presented to the ER afebrile, hemodynamically stable.  Satting well on room air.  White count 11.7, hemoglobin 15.2, platelets 196, creatinine 1.9.  Glucose 157.  CT head and CT C-spine grossly stable.  Right ankle plain films with displaced distal fibular and medial malleolus fracture.   Assessment and Plan:  # Right ankle bimalleolar fracture closed, displaced # Right ankle deltoid ligament rupture S/p Mechanical Fall  Xray: R fibular fracture s/p mechanical fall at home (fell getting out of shower)  Orthopedic surgery consulted, s/p Right bimalleolar ankle fracture open reduction with internal fixation and Repair of right ankle deltoid ligament done on 3/2 Podiatry recommended to follow-up in 2 weeks for evaluation of incision and suture removal Continue none weightbearing right lower extremity for 6 weeks, Aspirin 81 mg p.o. twice daily and as needed meds for pain control.  Podiatry signed off. Continue prn meds for pain control Continue fall precautions PT and OT evaluation done, Rec SNF placement TOC following, awaiting for SNF placement      # CKD (chronic kidney disease) Cr 1.9 w/ GFR 30s on  presentation  Suspect this may be near baseline in review of prior renal function roughly 5 years ago  Will monitor renal function  Minimize nephrotoxic agents  Follow   # Hypothyroidism, unspecified Cont synthroid      # HTN (hypertension) and HLD Resumed statin Started low-dose Toprol-XL 25 mg p.o. daily Held losartan for now BP and titrate medications accordingly    # Vitamin D level 36, started vitamin D 50,000 units p.o. weekly to prevent vitamin D deficiency # Vitamin B12 level 246, goal >400, started vitamin B12 1000 mcg IM injection during hospital stay followed by oral supplement. Repeat vitamin D and B12 level after 3 to 6 months.  # Constipation, started laxatives, use as needed suppository and enema.   Body mass index is 27.26 kg/m.  Interventions:  Diet: Regular diet DVT Prophylaxis: Subcutaneous Lovenox   Advance goals of care discussion: Full code  Family Communication: family was not present at bedside, at the time of interview.  Patient is AAO x 1   Disposition:  Pt is from Home, admitted with Fall and right ankle fracture, s/p repair done on 3/2, Needs PT and OT eval for possible SNF placement.  Medically stable to discharge to SNF when bed will be available. Follow TOC for disposition plan, most likely DC tomorrow a.m.  Subjective: No significant events overnight, patient is AAO x 1, knows his first name only.  Resting comfortably without any acute distress, seems pain is under control.    Physical Exam: General: NAD, lying comfortably Appear in no distress, affect appropriate Eyes: PERRLA ENT: Oral Mucosa Clear, moist  Neck: no JVD,  Cardiovascular: S1 and S2 Present, no Murmur,  Respiratory: good respiratory effort, Bilateral Air entry equal and Decreased, no Crackles, no wheezes Abdomen: Bowel Sound present, Soft and no tenderness,  Skin: no rashes Extremities: Right ankle postop dressing intact, LLE No pedal edema, no calf  tenderness Neurologic: without any new focal findings Gait not checked due to patient safety concerns  Vitals:   04/10/23 1545 04/10/23 2206 04/11/23 0739 04/11/23 1537  BP: 135/73 (!) 145/85 121/78 126/81  Pulse: 83 85 81 74  Resp: 16 16 17 17   Temp: 98.5 F (36.9 C)  99 F (37.2 C) (!) 97.4 F (36.3 C)  TempSrc:      SpO2: 96% 98% 97% 96%  Weight:      Height:        Intake/Output Summary (Last 24 hours) at 04/11/2023 1653 Last data filed at 04/11/2023 0913 Gross per 24 hour  Intake --  Output 1975 ml  Net -1975 ml    Filed Weights   04/05/23 0325  Weight: 86.2 kg    Data Reviewed: I have personally reviewed and interpreted daily labs, tele strips, imagings as discussed above. I reviewed all nursing notes, pharmacy notes, vitals, pertinent old records I have discussed plan of care as described above with RN and patient/family.  CBC: Recent Labs  Lab 04/05/23 0330 04/06/23 0702 04/07/23 0530 04/08/23 0210 04/09/23 0600 04/10/23 0455 04/11/23 0632  WBC 11.7*   < > 8.1 11.2* 11.1* 9.2 9.5  NEUTROABS 9.4*  --   --   --   --   --   --   HGB 15.2   < > 13.5 13.2 13.6 13.6 14.1  HCT 46.8   < > 41.5 39.6 42.2 42.4 43.1  MCV 87.0   < > 87.4 88.8 87.7 87.8 86.4  PLT 196   < > 165 193 215 197 220   < > = values in this interval not displayed.   Basic Metabolic Panel: Recent Labs  Lab 04/06/23 0702 04/07/23 0530 04/08/23 0210 04/09/23 0600 04/10/23 0455 04/11/23 0632  NA 141 140 137 140 138 138  K 4.2 3.6 4.0 3.9 4.1 4.2  CL 108 106 103 106 107 103  CO2 27 28 25 29 26 26   GLUCOSE 133* 120* 232* 127* 146* 156*  BUN 38* 41* 44* 46* 43* 38*  CREATININE 1.90* 1.78* 1.87* 1.81* 1.66* 1.54*  CALCIUM 8.7* 8.4* 7.9* 8.1* 8.0* 8.1*  MG 2.3 2.3  --   --   --   --   PHOS 3.6 3.2  --   --   --   --     Studies: No results found.   Scheduled Meds:  atorvastatin  40 mg Oral QHS   bisacodyl  10 mg Oral QHS   cyanocobalamin  1,000 mcg Intramuscular Daily    Followed by   Melene Muller ON 04/13/2023] vitamin B-12  1,000 mcg Oral Daily   enoxaparin (LOVENOX) injection  40 mg Subcutaneous Q24H   levothyroxine  75 mcg Oral Q0600   metoprolol succinate  25 mg Oral Daily   polyethylene glycol  17 g Oral BID   SMOG  960 mL Rectal Once   Vitamin D (Ergocalciferol)  50,000 Units Oral Q7 days   Continuous Infusions: PRN Meds: acetaminophen **OR** acetaminophen, albuterol, bisacodyl, morphine injection, ondansetron **OR** ondansetron (ZOFRAN) IV, oxyCODONE  Time spent: 35 minutes  Author: Gillis Santa. MD [facility] 04/11/2023 4:53 PM  To reach On-call, see care  teams to locate the attending and reach out to them via [email or web address]. If 7PM-7AM, please contact night-coverage If you still have difficulty reaching the attending provider, please page the [facility] (Director on Call) for [facility] on amion for assistance.

## 2023-04-11 NOTE — TOC Progression Note (Signed)
 Transition of Care [facility]) - Progression Note    Patient Details  Name: Chad Vazquez MRN: [id number] Date of Birth: 08-26-1955  [facility]) CM/SW Contact  Marlowe Sax, RN Phone Number: 04/11/2023, 3:55 PM  Clinical Narrative:    Approved Plan Auth ID: [id number] 3/5-04/12/23 next review 04/12/23    Expected Discharge Plan: Skilled Nursing Facility Barriers to Discharge: Insurance Authorization  Expected Discharge Plan and Services   Discharge Planning Services: CM Consult   Living arrangements for the past 2 months: Single Family Home                   DME Agency: NA                   Social Determinants of Health (SDOH) Interventions SDOH Screenings   Food Insecurity: Patient Declined (04/05/2023)  Housing: Patient Declined (04/05/2023)  Transportation Needs: Patient Declined (04/05/2023)  Utilities: Patient Declined (04/05/2023)  Social Connections: Patient Declined (04/05/2023)  Tobacco Use: Low Risk  (04/05/2023)    Readmission Risk Interventions     No data to display

## 2023-04-11 NOTE — Progress Notes (Signed)
 Physical Therapy Treatment Patient Details Name: Chad Vazquez MRN: [id number] DOB: 02/08/1955 Today's Date: 04/11/2023   History of Present Illness Pt is a 68 y.o. male with medical history significant of hypertension, intellectual disability, CKD, hypothyroidism who presents with right ankle pain after sustaining 2 falls.  Patient has a history of intellectual disability and gait instability. Pt diagnosed with bimalleolar right ankle fracture and is now s/p R ankle ORIF and ligament repair.    PT Comments  Pt resting in bed upon PT arrival; pt agreeable to physical therapy; pt reporting no pain during session (no pain-like behavior noted during sessions activities).  During session pt 1-2 assist with bed mobility; SBA sitting balance; and max assist x2 to stand up to RW and laterally scoot to R along bed (bed height elevated and assist to maintain R LE NWB'ing).  Will continue to focus on strengthening and progressive functional mobility during hospitalization.   If plan is discharge home, recommend the following: Two people to help with walking and/or transfers;Two people to help with bathing/dressing/bathroom;Assist for transportation;Help with stairs or ramp for entrance;Direct supervision/assist for medications management;Direct supervision/assist for financial management;Assistance with cooking/housework;Supervision due to cognitive status   Can travel by private vehicle      No  Equipment Recommendations  Other (comment) (TBD at next facility)    Recommendations for Other Services       Precautions / Restrictions Precautions Precautions: Fall Recall of Precautions/Restrictions: Impaired Required Braces or Orthoses: Splint/Cast Splint/Cast: R ankle Restrictions Weight Bearing Restrictions Per Provider Order: Yes RLE Weight Bearing Per Provider Order: Non weight bearing     Mobility  Bed Mobility Overal bed mobility: Needs Assistance Bed Mobility: Supine to Sit, Sit to  Supine   Sidelying to sit: Max assist, HOB elevated   Sit to supine: Mod assist, +2 for physical assistance   General bed mobility comments: assist for R LE and trunk; vc's for technique    Transfers Overall transfer level: Needs assistance Equipment used: Rolling walker (2 wheels) Transfers: Sit to/from Stand, Bed to chair/wheelchair/BSC Sit to Stand: Max assist, +2 physical assistance, From elevated surface          Lateral/Scoot Transfers: Max assist, +2 physical assistance General transfer comment: x1 trial standing from elevated bed height up to RW; vc's for UE/LE placement/positioning; R foot held out with therapists foot to ensure WB'ing compliance; max assist x2 to attempt lateral scooting to R along bed x3 trials (minimal movement noted with 2 assist and max cueing; use of bed pad under pt to assist with scooting)    Ambulation/Gait               General Gait Details: not appropriate at this time   Stairs             Wheelchair Mobility     Tilt Bed    Modified Rankin (Stroke Patients Only)       Balance Overall balance assessment: Needs assistance, History of Falls Sitting-balance support: Bilateral upper extremity supported, Feet unsupported Sitting balance-Leahy Scale: Fair Sitting balance - Comments: steady static sitting   Standing balance support: Bilateral upper extremity supported, Reliant on assistive device for balance Standing balance-Leahy Scale: Poor Standing balance comment: +2 assist to maintain standing (B UE support on RW)                            Communication Communication Communication: Impaired Factors Affecting Communication: Hearing impaired;Reduced clarity  of speech  Cognition Arousal: Alert Behavior During Therapy: Flat affect   PT - Cognitive impairments: History of cognitive impairments, Awareness, Safety/Judgement, Problem solving, Sequencing, Initiation, Attention, Memory, Orientation                        PT - Cognition Comments: Pt able to stae first name only; per chart h/o intellectual disability Following commands: Impaired Following commands impaired: Follows one step commands with increased time    Cueing Cueing Techniques: Verbal cues, Gestural cues, Tactile cues, Visual cues  Exercises General Exercises - Lower Extremity Heel Slides: AAROM, Strengthening, Both, 10 reps, Supine Hip ABduction/ADduction: AAROM, Strengthening, Right, 10 reps, Supine Straight Leg Raises: AAROM, Strengthening, Right, 10 reps, Supine    General Comments  Pt agreeable to PT session.      Pertinent Vitals/Pain Pain Assessment Pain Assessment: No/denies pain (pt reporting no pain; no pain-like behaviour noted) Pain Intervention(s): Limited activity within patient's tolerance, Monitored during session Vitals (HR and SpO2 on room air) stable during treatment session.    Home Living                          Prior Function            PT Goals (current goals can now be found in the care plan section) Acute Rehab PT Goals PT Goal Formulation: Patient unable to participate in goal setting Progress towards PT goals: Progressing toward goals    Frequency    Min 3X/week      PT Plan      Co-evaluation              AM-PAC PT "6 Clicks" Mobility   Outcome Measure  Help needed turning from your back to your side while in a flat bed without using bedrails?: A Lot Help needed moving from lying on your back to sitting on the side of a flat bed without using bedrails?: A Lot Help needed moving to and from a bed to a chair (including a wheelchair)?: Total Help needed standing up from a chair using your arms (e.g., wheelchair or bedside chair)?: Total Help needed to walk in hospital room?: Total Help needed climbing 3-5 steps with a railing? : Total 6 Click Score: 8    End of Session Equipment Utilized During Treatment: Gait belt Activity Tolerance: Patient  tolerated treatment well Patient left: in bed;with call bell/phone within reach;with bed alarm set;Other (comment) (R LE elevated via 2 pillows; L LE elevated with heel floating via pillow support) Nurse Communication: Mobility status;Precautions;Other (comment) (Pt's pain status) PT Visit Diagnosis: Other abnormalities of gait and mobility (R26.89);Muscle weakness (generalized) (M62.81);History of falling (Z91.81);Pain Pain - Right/Left: Right Pain - part of body: Ankle and joints of foot     Time: 1049-1108 PT Time Calculation (min) (ACUTE ONLY): 19 min  Charges:    $Therapeutic Activity: 8-22 mins PT General Charges $$ ACUTE PT VISIT: 1 Visit                     Hendricks Limes, PT 04/11/23, 11:29 AM

## 2023-04-11 NOTE — Plan of Care (Signed)

## 2023-04-11 NOTE — TOC Progression Note (Signed)
 Transition of Care [facility]) - Progression Note    Patient Details  Name: Chad Vazquez MRN: [id number] Date of Birth: [birth year]  [facility]) CM/SW Contact  Marlowe Sax, RN Phone Number: 04/11/2023, 11:11 AM  Clinical Narrative:     Ins pending to go to [facility], Will need EMS transport       Expected Discharge Plan and Services                                               Social Determinants of Health (SDOH) Interventions SDOH Screenings   Food Insecurity: Patient Declined (04/05/2023)  Housing: Patient Declined (04/05/2023)  Transportation Needs: Patient Declined (04/05/2023)  Utilities: Patient Declined (04/05/2023)  Social Connections: Patient Declined (04/05/2023)  Tobacco Use: Low Risk  (04/05/2023)    Readmission Risk Interventions     No data to display

## 2023-04-12 DIAGNOSIS — S82841A Displaced bimalleolar fracture of right lower leg, initial encounter for closed fracture: Secondary | ICD-10-CM | POA: Diagnosis not present

## 2023-04-12 DIAGNOSIS — S82891A Other fracture of right lower leg, initial encounter for closed fracture: Secondary | ICD-10-CM | POA: Diagnosis not present

## 2023-04-12 MED ORDER — ACETAMINOPHEN 325 MG PO TABS: 650.0000 mg | ORAL_TABLET | Freq: Four times a day (QID) | ORAL | Status: AC | PRN

## 2023-04-12 MED ORDER — HYDROCORTISONE 2.5 % RE CREA: 1.0000 | TOPICAL_CREAM | Freq: Two times a day (BID) | RECTAL | Status: AC | PRN

## 2023-04-12 MED ORDER — POLYETHYLENE GLYCOL 3350 17 G PO PACK: 17.0000 g | PACK | Freq: Two times a day (BID) | ORAL | Status: AC

## 2023-04-12 MED ORDER — METOPROLOL SUCCINATE ER 25 MG PO TB24: 25.0000 mg | ORAL_TABLET | Freq: Every day | ORAL | Status: AC

## 2023-04-12 MED ORDER — CYANOCOBALAMIN 1000 MCG PO TABS: 1000.0000 ug | ORAL_TABLET | Freq: Every day | ORAL | Status: AC

## 2023-04-12 MED ORDER — VITAMIN D (ERGOCALCIFEROL) 1.25 MG (50000 UNIT) PO CAPS: 50000.0000 [IU] | ORAL_CAPSULE | ORAL | Status: AC

## 2023-04-12 MED ORDER — BISACODYL 5 MG PO TBEC: 10.0000 mg | DELAYED_RELEASE_TABLET | Freq: Every day | ORAL | Status: AC

## 2023-04-12 MED ORDER — ASPIRIN 81 MG PO TBEC: 81.0000 mg | DELAYED_RELEASE_TABLET | Freq: Two times a day (BID) | ORAL | Status: AC

## 2023-04-12 NOTE — Plan of Care (Signed)

## 2023-04-12 NOTE — TOC Progression Note (Signed)
 Transition of Care [facility]) - Progression Note    Patient Details  Name: DOMINIE BENEDICK MRN: [id number] Date of Birth: 07/16/1955  [facility] of [facility]) CM/SW Contact  Marlowe Sax, RN Phone Number: 04/12/2023, 2:17 PM  Clinical Narrative:    Spoke with brother [name] notified him that the patient will go to room 513 at [facility] , EMS called [facility] to arrange transport   Expected Discharge Plan: Skilled Nursing Facility Barriers to Discharge: English as a second language teacher  Expected Discharge Plan and Services   Discharge Planning Services: CM Consult   Living arrangements for the past 2 months: Single Family Home Expected Discharge Date: 04/12/23                 DME Agency: NA                   Social Determinants of Health (SDOH) Interventions SDOH Screenings   Food Insecurity: Patient Declined (04/05/2023)  Housing: Patient Declined (04/05/2023)  Transportation Needs: Patient Declined (04/05/2023)  Utilities: Patient Declined (04/05/2023)  Social Connections: Patient Declined (04/05/2023)  Tobacco Use: Low Risk  (04/05/2023)    Readmission Risk Interventions     No data to display

## 2023-04-12 NOTE — Plan of Care (Signed)

## 2023-04-12 NOTE — Progress Notes (Signed)
 Nurse called report over to [employer].

## 2023-04-12 NOTE — Discharge Summary (Signed)
 [facility] Discharge Summary   Patient: Chad Vazquez ZOX:[id number]  PCP: [facility]  Date of admission: 04/05/2023   Date of discharge:  04/12/2023     Discharge Diagnoses:  Principal Problem:   Ankle fracture Active Problems:   CKD (chronic kidney disease)   HTN (hypertension)   Hypothyroidism, unspecified   Admitted From: Home Disposition:  SNF   Recommendations for Outpatient Follow-up:  Follow with PCP, patient should be seen by an MD in 1 to 2 days, continue to monitor BP and titrate medication accordingly.  Decreased Toprol-XL from 100 to 25 mg p.o. daily and held losartan for now due to low blood pressure. Continue Tylenol for pain management, may use oxycodone as needed but patient is not asking for pain medication.  He may need oxycodone while working with physical therapy. Follow-up with podiatry in 2 weeks Follow up LABS/TEST: Monitor BP   Contact information for follow-up providers     Rosetta Posner, DPM. Schedule an appointment as soon as possible for a visit in 2 week(s).   Specialty: Podiatry Why: For suture removal, For wound re-check Contact information: [address] [place] [address] [phone]              Contact information for after-discharge care     Destination     [facility] Preferred SNF .   Service: Skilled Nursing Contact information: [address] [place] [address] [phone]                    Diet recommendation: Cardiac diet  Activity: The patient is advised to gradually reintroduce usual activities, as tolerated  Discharge Condition: stable  Code Status: Full code   History of present illness: As per the H and P dictated on admission Hospital Course:  Chad Vazquez is a 68 y.o. male with medical history significant of hypertension, intellectual disability, hypothyroidism  presenting with mechanical fall, right ankle fracture.  Patient with noted baseline intellectual disability.  Also with intermittent gait instability.  Has to hold onto furniture walk around house.  Per report, patient with fall, shallow.  No reported weakness or dizziness prior to event.  Fall was not witnessed.  At present, patient denies any headache.  No chest pain or shortness of breath.  Has significant right ankle pain after fall. Presented to the ER afebrile, hemodynamically stable.  Satting well on room air.  White count 11.7, hemoglobin 15.2, platelets 196, creatinine 1.9.  Glucose 157.  CT head and CT C-spine grossly stable.  Right ankle plain films with displaced distal fibular and medial malleolus fracture.     Assessment and Plan:   # Right ankle bimalleolar fracture closed, displaced # Right ankle deltoid ligament rupture S/p Mechanical Fall Xray: R fibular fracture s/p mechanical fall at home (fell getting out of shower)  Orthopedic surgery consulted, s/p Right bimalleolar ankle fracture open reduction with internal fixation and Repair of right ankle deltoid ligament done on 3/2 Podiatry recommended to follow-up in 2 weeks for evaluation of incision and suture removal Continue none weightbearing right lower extremity for 6 weeks, Aspirin 81 mg p.o. twice daily and as needed meds for pain control.  Podiatry signed off. Continue prn meds for pain control, Continue fall precautions PT and OT evaluation done, Rec SNF placement which has been arranged by TOC.  Patient is clinically stable to discharge.  # CKD (chronic kidney disease)  Cr 1.54, improved continue oral hydration. # Hypothyroidism, unspecified: Cont synthroid  # HTN (hypertension) and HLD: Resumed statin Decreased Toprol-XL 25 mg p.o. daily, Held losartan for now Monitor BP and titrate medications accordingly # Vitamin D level 36, started vitamin D 50,000 units p.o. weekly to prevent vitamin D deficiency # Vitamin B12  level 246, goal >400, started vitamin B12 1000 mcg IM injection during hospital stay followed by oral supplement. Repeat vitamin D and B12 level after 3 to 6 months. # Constipation, resolved, continue laxatives, use prn suppository and enema.   Body mass index is 27.26 kg/m.  Nutrition Interventions:  Patient was seen by physical therapy, who recommended Therapy, SNF placement, which was arranged. On the day of the discharge the patient's vitals were stable, and no other acute medical condition were reported by patient. the patient was felt safe to be discharge at [facility].  Consultants: Podiatry Procedures: s/p Right bimalleolar ankle fracture open reduction with internal fixation and Repair of right ankle deltoid ligament done on 3/2  Discharge Exam: General: Appear in no distress, no Rash; Oral Mucosa Clear, moist. Cardiovascular: S1 and S2 Present, no Murmur, Respiratory: normal respiratory effort, Bilateral Air entry present and no Crackles, no wheezes Abdomen: Bowel Sound present, Soft and no tenderness, no hernia Extremities: s/p right ankle ORIF, dressing CDI  Neurology: AAAOx 1, no focal deficits.   affect appropriate.  Filed Weights   04/05/23 0325  Weight: 86.2 kg   Vitals:   04/11/23 2246 04/12/23 0848  BP: 118/87 (!) 137/90  Pulse: 80 81  Resp: 18 16  Temp: (!) 97.5 F (36.4 C) 98 F (36.7 C)  SpO2: 94% 97%    DISCHARGE MEDICATION: Allergies as of 04/12/2023   No Known Allergies      Medication List     PAUSE taking these medications    losartan 100 MG tablet Wait to take this until your doctor or other care provider tells you to start again. Commonly known as: COZAAR Take 100 mg by mouth daily. for high blood pressure       STOP taking these medications    doxycycline 100 MG tablet Commonly known as: VIBRA-TABS   predniSONE 10 MG (21) Tbpk tablet Commonly known as: STERAPRED UNI-PAK 21 TAB       TAKE these medications    acetaminophen 325  MG tablet Commonly known as: TYLENOL Take 2 tablets (650 mg total) by mouth every 6 (six) hours as needed for mild pain (pain score 1-3) (or Fever >/= 101).   albuterol 108 (90 Base) MCG/ACT inhaler Commonly known as: VENTOLIN HFA Inhale 2 puffs into the lungs every 6 (six) hours as needed for wheezing or shortness of breath.   aspirin EC 81 MG tablet Take 1 tablet (81 mg total) by mouth 2 (two) times daily. Swallow whole.   atorvastatin 40 MG tablet Commonly known as: LIPITOR Take 40 mg by mouth at bedtime. What changed: Another medication with the same name was removed. Continue taking this medication, and follow the directions you see here.   bisacodyl 5 MG EC tablet Commonly known as: DULCOLAX Take 2 tablets (10 mg total) by mouth at bedtime.   cyanocobalamin 1000 MCG tablet Take 1 tablet (1,000 mcg total) by mouth daily. Start taking on: April 13, 2023   Farxiga 10 MG Tabs tablet Generic drug: dapagliflozin propanediol Take 10 mg by mouth daily.   fluticasone 50 MCG/ACT nasal spray Commonly known as: FLONASE Place 2 sprays into both nostrils daily.  hydrocortisone 2.5 % rectal cream Commonly known as: ANUSOL-HC Place 1 Application rectally 2 (two) times daily as needed for hemorrhoids or anal itching. What changed:  when to take this reasons to take this   levothyroxine 75 MCG tablet Commonly known as: SYNTHROID Take by mouth.   metoprolol succinate 25 MG 24 hr tablet Commonly known as: TOPROL-XL Take 1 tablet (25 mg total) by mouth daily. for high blood pressure What changed:  medication strength how much to take   polyethylene glycol 17 g packet Commonly known as: MIRALAX / GLYCOLAX Take 17 g by mouth 2 (two) times daily.   sodium chloride 0.65 % Soln nasal spray Commonly known as: OCEAN Place 2 sprays into both nostrils every 2 (two) hours while awake.   Vitamin D (Ergocalciferol) 1.25 MG (50000 UNIT) Caps capsule Commonly known as: DRISDOL Take 1  capsule (50,000 Units total) by mouth every 7 (seven) days. Start taking on: April 14, 2023       No Known Allergies Discharge Instructions     Call MD for:  difficulty breathing, headache or visual disturbances   Complete by: As directed    Call MD for:  extreme fatigue   Complete by: As directed    Call MD for:  persistant dizziness or light-headedness   Complete by: As directed    Call MD for:  persistant nausea and vomiting   Complete by: As directed    Call MD for:  severe uncontrolled pain   Complete by: As directed    Call MD for:  temperature >100.4   Complete by: As directed    Diet - low sodium heart healthy   Complete by: As directed    Discharge instructions   Complete by: As directed    Follow with PCP, patient should be seen by an MD in 1 to 2 days, continue to monitor BP and titrate medication accordingly.  Decreased Toprol-XL from 100 to 25 mg p.o. daily and held losartan for now due to low blood pressure. Continue Tylenol for pain management, may use oxycodone as needed but patient is not asking for pain medication.  He may need oxycodone while working with physical therapy. Follow-up with podiatry in 2 weeks, continue aspirin 81 mg p.o. twice daily for 6 weeks for DVT prophylaxis.   Increase activity slowly   Complete by: As directed        The results of significant diagnostics from this hospitalization (including imaging, microbiology, ancillary and laboratory) are listed below for reference.    Significant Diagnostic Studies: DG Ankle 2 Views Right Result Date: 04/07/2023 CLINICAL DATA:  Intraoperative fluoroscopy 4 fibula fracture fixation. EXAM: RIGHT ANKLE - 2 VIEW FLUOROSCOPY: Fluoroscopy Time:  42 seconds Radiation Exposure Index (if provided by the fluoroscopic device): 1.41 mGy Number of Acquired Spot Images: 8 COMPARISON:  Ankle radiographs dated 04/05/2023. FINDINGS: Intraoperative fluoroscopy images demonstrate fixation with plate and screws for a  distal fibula fracture. Osseous alignment is improved. The hardware appears intact. IMPRESSION: Intraoperative fluoroscopy for distal fibula fracture fixation. Electronically Signed   By: Romona Curls M.D.   On: 04/07/2023 16:07   DG C-Arm 1-60 Min-No Report Result Date: 04/07/2023 Fluoroscopy was utilized by the requesting physician.  No radiographic interpretation.   DG C-Arm 1-60 Min-No Report Result Date: 04/07/2023 Fluoroscopy was utilized by the requesting physician.  No radiographic interpretation.   CT Head Wo Contrast Result Date: 04/05/2023 CLINICAL DATA:  Blunt trauma with ankle fracture. EXAM: CT HEAD WITHOUT CONTRAST CT  CERVICAL SPINE WITHOUT CONTRAST TECHNIQUE: Multidetector CT imaging of the head and cervical spine was performed following the standard protocol without intravenous contrast. Multiplanar CT image reconstructions of the cervical spine were also generated. RADIATION DOSE REDUCTION: This exam was performed according to the departmental dose-optimization program which includes automated exposure control, adjustment of the mA and/or kV according to patient size and/or use of iterative reconstruction technique. COMPARISON:  01/28/2007 FINDINGS: CT HEAD FINDINGS Brain: No evidence of acute infarction, hemorrhage, hydrocephalus, extra-axial collection or mass lesion/mass effect. Brain atrophy especially affecting the cerebellum with secondary ventriculomegaly. Chronic small vessel ischemia in the cerebral white matter with small lacunar infarcts at the left caudate and deep white matter. Vascular: No hyperdense vessel or unexpected calcification. Skull: Normal. Negative for fracture or focal lesion. Sinuses/Orbits: No acute finding. Retention cyst appearance in the right maxillary sinus where there is also mucosal thickening, also seen in 2008. CT CERVICAL SPINE FINDINGS Alignment: Reversal of cervical lordosis.  No listhesis. Skull base and vertebrae: No acute fracture. No primary bone  lesion or focal pathologic process. Soft tissues and spinal canal: No prevertebral fluid or swelling. No visible canal hematoma. Disc levels: Degenerative endplate and facet spurring greatest at C3-4. Upper chest: No evidence of injury IMPRESSION: 1. No evidence of acute intracranial or cervical spine injury. 2. 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 Electronically Signed   By: Tiburcio Pea M.D.   On: 04/05/2023 06:07   CT Cervical Spine Wo Contrast Result Date: 04/05/2023 CLINICAL DATA:  Blunt trauma with ankle fracture. EXAM: CT HEAD WITHOUT CONTRAST CT CERVICAL SPINE WITHOUT CONTRAST TECHNIQUE: Multidetector CT imaging of the head and cervical spine was performed following the standard protocol without intravenous contrast. Multiplanar CT image reconstructions of the cervical spine were also generated. RADIATION DOSE REDUCTION: This exam was performed according to the departmental dose-optimization program which includes automated exposure control, adjustment of the mA and/or kV according to patient size and/or use of iterative reconstruction technique. COMPARISON:  01/28/2007 FINDINGS: CT HEAD FINDINGS Brain: No evidence of acute infarction, hemorrhage, hydrocephalus, extra-axial collection or mass lesion/mass effect. Brain atrophy especially affecting the cerebellum with secondary ventriculomegaly. Chronic small vessel ischemia in the cerebral white matter with small lacunar infarcts at the left caudate and deep white matter. Vascular: No hyperdense vessel or unexpected calcification. Skull: Normal. Negative for fracture or focal lesion. Sinuses/Orbits: No acute finding. Retention cyst appearance in the right maxillary sinus where there is also mucosal thickening, also seen in 2008. CT CERVICAL SPINE FINDINGS Alignment: Reversal of cervical lordosis.  No listhesis. Skull base and vertebrae: No acute fracture. No primary bone lesion or focal pathologic process. Soft tissues  and spinal canal: No prevertebral fluid or swelling. No visible canal hematoma. Disc levels: Degenerative endplate and facet spurring greatest at C3-4. Upper chest: No evidence of injury IMPRESSION: 1. No evidence of acute intracranial or cervical spine injury. 2. 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 Electronically Signed   By: Tiburcio Pea M.D.   On: 04/05/2023 06:07   DG Ankle Complete Right Result Date: 04/05/2023 CLINICAL DATA:  Fall.  Ankle pain EXAM: RIGHT ANKLE - COMPLETE 3+ VIEW COMPARISON:  None Available. FINDINGS: Oblique distal fibular shaft fracture at and above the ankle joint with posterior displacement of 100% on the lateral view. Fragmentation at the medial malleolus as well. No ankle mortise widening on this nonweightbearing view. Located hindfoot which also appears intact, corticated fragmentation at the peroneus  ossicle. Small heel spurs. Expected soft tissue swelling. IMPRESSION: Displaced distal fibular and medial malleolus fractures. Electronically Signed   By: Tiburcio Pea M.D.   On: 04/05/2023 05:49    Microbiology: No results found for this or any previous visit (from the past 240 hours).   Labs: CBC: Recent Labs  Lab 04/07/23 0530 04/08/23 0210 04/09/23 0600 04/10/23 0455 04/11/23 0632  WBC 8.1 11.2* 11.1* 9.2 9.5  HGB 13.5 13.2 13.6 13.6 14.1  HCT 41.5 39.6 42.2 42.4 43.1  MCV 87.4 88.8 87.7 87.8 86.4  PLT 165 193 215 197 220   Basic Metabolic Panel: Recent Labs  Lab 04/06/23 0702 04/07/23 0530 04/08/23 0210 04/09/23 0600 04/10/23 0455 04/11/23 0632  NA 141 140 137 140 138 138  K 4.2 3.6 4.0 3.9 4.1 4.2  CL 108 106 103 106 107 103  CO2 27 28 25 29 26 26   GLUCOSE 133* 120* 232* 127* 146* 156*  BUN 38* 41* 44* 46* 43* 38*  CREATININE 1.90* 1.78* 1.87* 1.81* 1.66* 1.54*  CALCIUM 8.7* 8.4* 7.9* 8.1* 8.0* 8.1*  MG 2.3 2.3  --   --   --   --   PHOS 3.6 3.2  --   --   --   --    Liver Function Tests: Recent Labs   Lab 04/06/23 0702  AST 23  ALT 23  ALKPHOS 73  BILITOT 1.0  PROT 6.6  ALBUMIN 3.1*   No results for input(s): "LIPASE", "AMYLASE" in the last 168 hours. No results for input(s): "AMMONIA" in the last 168 hours. Cardiac Enzymes: Recent Labs  Lab 04/06/23 0702  CKTOTAL 163   BNP (last 3 results) No results for input(s): "BNP" in the last 8760 hours. CBG: No results for input(s): "GLUCAP" in the last 168 hours.  Time spent: 35 minutes  Signed:  Gillis Santa  [facility] 04/12/2023 1:37 PM

## 2023-04-12 NOTE — Progress Notes (Signed)
 Patient discharged and transported out of facility by EMS

## 2023-04-12 NOTE — Progress Notes (Signed)
 Mobility Specialist - Progress Note   04/12/23 1135  Mobility  Activity Stood at bedside;Dangled on edge of bed  Level of Assistance Moderate assist, patient does 50-74%  Assistive Device Front wheel walker  Distance Ambulated (ft) 2 ft  RLE Weight Bearing Per Provider Order NWB  Activity Response Tolerated well  Mobility visit 1 Mobility  Mobility Specialist Start Time (ACUTE ONLY) 1050  Mobility Specialist Stop Time (ACUTE ONLY) 1111  Mobility Specialist Time Calculation (min) (ACUTE ONLY) 21 min   Pt supine upon entry, utilizing RA. Pt agreeable to OOB activity this date. Pt required Max 2 person assist for bed mob, SBA while seated EOB. Pt STS to RW MaxA +2 x2, voiced min pain of the RLE. Pt stood for ~20-25 seconds each bout-- Pt noted to be soiled upon standing. Pt STS ModA +2 and laterally scoots towards the [facility], MS places foot under RLE to remain NWB-- foot blocking of the LLE to prevent slippage. Pt returned supine, rolled L and R ModA while MS completed peri care. Pt left semi fowler with alarm set and needs within reach. RN and NT notified.  Zetta Bills Mobility Specialist 04/12/23 11:47 AM
